# Patient Record
Sex: Female | Born: 1935 | Race: White | Hispanic: No | Smoking: Never smoker
Health system: Southern US, Community
[De-identification: ages and names within clinical notes are randomized; demographics above are authoritative.]

## PROBLEM LIST (undated history)

## (undated) DIAGNOSIS — M1990 Unspecified osteoarthritis, unspecified site: Secondary | ICD-10-CM

## (undated) DIAGNOSIS — I480 Paroxysmal atrial fibrillation: Secondary | ICD-10-CM

## (undated) DIAGNOSIS — Z860100 Personal history of colon polyps, unspecified: Secondary | ICD-10-CM

## (undated) DIAGNOSIS — I4891 Unspecified atrial fibrillation: Secondary | ICD-10-CM

## (undated) DIAGNOSIS — K635 Polyp of colon: Secondary | ICD-10-CM

## (undated) DIAGNOSIS — I495 Sick sinus syndrome: Secondary | ICD-10-CM

## (undated) DIAGNOSIS — K5909 Other constipation: Secondary | ICD-10-CM

## (undated) DIAGNOSIS — R0600 Dyspnea, unspecified: Secondary | ICD-10-CM

## (undated) DIAGNOSIS — Z950 Presence of cardiac pacemaker: Secondary | ICD-10-CM

## (undated) DIAGNOSIS — R5383 Other fatigue: Secondary | ICD-10-CM

## (undated) DIAGNOSIS — I10 Essential (primary) hypertension: Secondary | ICD-10-CM

## (undated) DIAGNOSIS — R1310 Dysphagia, unspecified: Secondary | ICD-10-CM

## (undated) DIAGNOSIS — E785 Hyperlipidemia, unspecified: Secondary | ICD-10-CM

## (undated) DIAGNOSIS — F5104 Psychophysiologic insomnia: Secondary | ICD-10-CM

## (undated) DIAGNOSIS — I341 Nonrheumatic mitral (valve) prolapse: Secondary | ICD-10-CM

## (undated) DIAGNOSIS — K219 Gastro-esophageal reflux disease without esophagitis: Secondary | ICD-10-CM

## (undated) DIAGNOSIS — F419 Anxiety disorder, unspecified: Secondary | ICD-10-CM

## (undated) HISTORY — DX: Other fatigue: R53.83

## (undated) HISTORY — DX: Hyperlipidemia, unspecified: E78.5

## (undated) HISTORY — DX: Sick sinus syndrome: I49.5

## (undated) HISTORY — DX: Other constipation: K59.09

## (undated) HISTORY — DX: Dyspnea, unspecified: R06.00

## (undated) HISTORY — DX: Polyp of colon: K63.5

## (undated) HISTORY — DX: Unspecified osteoarthritis, unspecified site: M19.90

## (undated) HISTORY — PX: CATARACT EXTRACTION: SUR2

## (undated) HISTORY — DX: Psychophysiologic insomnia: F51.04

## (undated) HISTORY — DX: Personal history of colon polyps, unspecified: Z86.0100

## (undated) HISTORY — DX: Presence of cardiac pacemaker: Z95.0

## (undated) HISTORY — DX: Essential (primary) hypertension: I10

## (undated) HISTORY — PX: ENDOMETRIAL BIOPSY: SHX622

## (undated) HISTORY — DX: Dysphagia, unspecified: R13.10

## (undated) HISTORY — DX: Unspecified atrial fibrillation: I48.91

## (undated) HISTORY — DX: Nonrheumatic mitral (valve) prolapse: I34.1

## (undated) HISTORY — DX: Personal history of colonic polyps: Z86.010

## (undated) HISTORY — DX: Anxiety disorder, unspecified: F41.9

## (undated) HISTORY — DX: Paroxysmal atrial fibrillation: I48.0

## (undated) HISTORY — DX: Gastro-esophageal reflux disease without esophagitis: K21.9

---

## 1968-12-21 HISTORY — PX: CYSTECTOMY: SUR359

## 1998-08-19 ENCOUNTER — Ambulatory Visit (HOSPITAL_COMMUNITY): Admission: RE | Admit: 1998-08-19 | Discharge: 1998-08-19 | Payer: Self-pay | Admitting: Gastroenterology

## 1999-03-19 ENCOUNTER — Emergency Department (HOSPITAL_COMMUNITY): Admission: EM | Admit: 1999-03-19 | Discharge: 1999-03-19 | Payer: Self-pay | Admitting: Emergency Medicine

## 2000-06-01 ENCOUNTER — Encounter: Admission: RE | Admit: 2000-06-01 | Discharge: 2000-06-01 | Payer: Self-pay | Admitting: *Deleted

## 2000-06-01 ENCOUNTER — Encounter: Payer: Self-pay | Admitting: *Deleted

## 2001-12-26 ENCOUNTER — Ambulatory Visit (HOSPITAL_COMMUNITY): Admission: RE | Admit: 2001-12-26 | Discharge: 2001-12-26 | Payer: Self-pay | Admitting: Gastroenterology

## 2003-01-01 ENCOUNTER — Other Ambulatory Visit: Admission: RE | Admit: 2003-01-01 | Discharge: 2003-01-01 | Payer: Self-pay | Admitting: Obstetrics and Gynecology

## 2004-03-13 ENCOUNTER — Encounter: Admission: RE | Admit: 2004-03-13 | Discharge: 2004-03-13 | Payer: Self-pay | Admitting: Internal Medicine

## 2004-12-29 ENCOUNTER — Encounter: Admission: RE | Admit: 2004-12-29 | Discharge: 2004-12-29 | Payer: Self-pay | Admitting: Internal Medicine

## 2005-02-16 ENCOUNTER — Ambulatory Visit (HOSPITAL_COMMUNITY): Admission: RE | Admit: 2005-02-16 | Discharge: 2005-02-16 | Payer: Self-pay | Admitting: Gastroenterology

## 2006-04-07 ENCOUNTER — Other Ambulatory Visit: Admission: RE | Admit: 2006-04-07 | Discharge: 2006-04-07 | Payer: Self-pay | Admitting: Obstetrics and Gynecology

## 2006-09-16 ENCOUNTER — Observation Stay (HOSPITAL_COMMUNITY): Admission: EM | Admit: 2006-09-16 | Discharge: 2006-09-17 | Payer: Self-pay | Admitting: Emergency Medicine

## 2006-09-17 ENCOUNTER — Encounter (INDEPENDENT_AMBULATORY_CARE_PROVIDER_SITE_OTHER): Payer: Self-pay | Admitting: Cardiology

## 2008-04-03 ENCOUNTER — Other Ambulatory Visit: Admission: RE | Admit: 2008-04-03 | Discharge: 2008-04-03 | Payer: Self-pay | Admitting: Obstetrics and Gynecology

## 2010-06-03 LAB — HM PAP SMEAR: HM Pap smear: NORMAL

## 2010-12-04 ENCOUNTER — Encounter: Payer: Self-pay | Admitting: Internal Medicine

## 2010-12-04 ENCOUNTER — Encounter
Admission: RE | Admit: 2010-12-04 | Discharge: 2010-12-04 | Payer: Self-pay | Source: Home / Self Care | Attending: Cardiology | Admitting: Cardiology

## 2010-12-07 ENCOUNTER — Encounter: Payer: Self-pay | Admitting: Internal Medicine

## 2010-12-23 ENCOUNTER — Encounter: Payer: Self-pay | Admitting: Internal Medicine

## 2010-12-31 ENCOUNTER — Encounter: Payer: Self-pay | Admitting: Internal Medicine

## 2011-01-02 ENCOUNTER — Encounter: Payer: Self-pay | Admitting: Internal Medicine

## 2011-01-05 ENCOUNTER — Ambulatory Visit
Admission: RE | Admit: 2011-01-05 | Discharge: 2011-01-05 | Payer: Self-pay | Source: Home / Self Care | Attending: Internal Medicine | Admitting: Internal Medicine

## 2011-01-05 ENCOUNTER — Encounter: Payer: Self-pay | Admitting: Internal Medicine

## 2011-01-05 ENCOUNTER — Other Ambulatory Visit: Payer: Self-pay | Admitting: Internal Medicine

## 2011-01-05 DIAGNOSIS — E785 Hyperlipidemia, unspecified: Secondary | ICD-10-CM | POA: Insufficient documentation

## 2011-01-05 DIAGNOSIS — I4891 Unspecified atrial fibrillation: Secondary | ICD-10-CM | POA: Insufficient documentation

## 2011-01-05 DIAGNOSIS — I059 Rheumatic mitral valve disease, unspecified: Secondary | ICD-10-CM | POA: Insufficient documentation

## 2011-01-05 DIAGNOSIS — I495 Sick sinus syndrome: Secondary | ICD-10-CM | POA: Insufficient documentation

## 2011-01-06 LAB — PROTIME-INR
INR: 1 ratio (ref 0.8–1.0)
Prothrombin Time: 11.5 s (ref 10.2–12.4)

## 2011-01-06 LAB — BRAIN NATRIURETIC PEPTIDE: Pro B Natriuretic peptide (BNP): 22.5 pg/mL (ref 0.0–100.0)

## 2011-01-06 LAB — BASIC METABOLIC PANEL
BUN: 20 mg/dL (ref 6–23)
CO2: 25 mEq/L (ref 19–32)
Calcium: 9.7 mg/dL (ref 8.4–10.5)
Chloride: 105 mEq/L (ref 96–112)
Creatinine, Ser: 0.6 mg/dL (ref 0.4–1.2)
GFR: 102.35 mL/min (ref >60.00)
Glucose, Bld: 73 mg/dL (ref 70–99)
Potassium: 4.3 mEq/L (ref 3.5–5.1)
Sodium: 139 mEq/L (ref 135–145)

## 2011-01-06 LAB — APTT: aPTT: 31.1 s — ABNORMAL HIGH (ref 21.7–28.8)

## 2011-01-09 ENCOUNTER — Inpatient Hospital Stay (HOSPITAL_COMMUNITY)
Admission: RE | Admit: 2011-01-09 | Discharge: 2011-01-10 | Payer: Self-pay | Source: Home / Self Care | Attending: Internal Medicine | Admitting: Internal Medicine

## 2011-01-09 DIAGNOSIS — I495 Sick sinus syndrome: Secondary | ICD-10-CM

## 2011-01-09 HISTORY — PX: PACEMAKER INSERTION: SHX728

## 2011-01-09 HISTORY — DX: Sick sinus syndrome: I49.5

## 2011-01-15 NOTE — Discharge Summary (Addendum)
Jamie Morales, Jamie Morales NO.:  [id number]  MEDICAL RECORD NO.:  [id number]          PATIENT TYPE:  INP  LOCATION:  3736                         FACILITY:  [facility]  PHYSICIAN:  Bevelyn Buckles. Bensimhon, MDDATE OF BIRTH:  04/01/1936  DATE OF ADMISSION:  01/09/2011 DATE OF DISCHARGE:  01/10/2011                              DISCHARGE SUMMARY   PRIMARY CARDIOLOGIST:  Georga Hacking, MD  PRIMARY ELECTROPHYSIOLOGIST:  Hillis Range, MD  PRIMARY CARE PHYSICIAN:  Barry Dienes. Eloise Harman, MD  PROCEDURES PERFORMED DURING HOSPITALIZATION:  Implantation of Medtronic Adapta L dual-chamber pacemaker in the setting of symptomatic bradycardia with tachycardia-bradycardia syndrome.  FINAL DISCHARGE DIAGNOSES: 1. 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 2. History of paroxysmal atrial fibrillation. 3. Gastroesophageal reflux disease. 4. Hyperlipidemia. 5. Mitral valve prolapse. 6. Degenerative joint disease.  HOSPITAL COURSE:  Jamie Morales is a 75 year old female with a history as stated above who presented for EP consultation with Dr. Johney Frame on January 05, 2011.  The patient had been placed on flecainide as a "pill in pocket" approach with daily aspirin.  The patient had required use of flecainide 1-2 times a month due to symptoms of atrial fibrillation with episodes lasting several hours.  The patient was seen by Dr. Donnie Aho and a monitor was placed and she was found to have documented sinus node dysfunction with pauses up to 4.3 seconds during wakeful and sleeping hours.  She also had some observed atrial fibrillation with heart rates in the 160s-170s.  It was found that she was not able to take any rate- control medication secondary to bradycardia.  As a result of this, Dr. Johney Frame felt that the patient would be a candidate for pacemaker in the setting of sick sinus syndrome and tachybrady syndrome.  He spoke with her thoroughly concerning the operation and  need for pacemaker and she agreed to proceed.  The patient was admitted and pacemaker was implanted on January 09, 2011.  This is a Medtronic Adapta L model ADDRL1, serial [id number] per Dr. Hillis Range.  The patient tolerated the procedure well without evidence of bleeding, hematoma, or signs of infection.  The patient was not placed on Coumadin as her CHADs score was found to be 0 in the setting of atrial fibrillation which was paroxysmal.  The pacemaker was re-interrogated postprocedure and it was found to be functioning appropriately.  The patient was seen and examined by Dr. Arvilla Meres on day of discharge and found to be stable.  She will follow up with Dr. Donnie Aho and Dr. Johney Frame for continued management.  Most recent labs dated January 05, 2011, reveals a PTT of 31.1, PT 11.5 with an INR of 1.0.  BNP 22.5.  Sodium 139, potassium 4.3, chloride 105, CO2 of 25, glucose 73, BUN 20, and creatinine 0.6.  Chest x-ray dated January 10, 2011, revealing new dual lead transvenous pacemaker at appropriate position without evidence of pneumothorax, stable cardiomegaly, no active lung disease.  VITAL SIGNS ON DISCHARGE:  Blood pressure 131/79, pulse 61, respirations 18, and the patient weighed 79.7 kg.  DISCHARGE MEDICATIONS: 1. Artificial tears 1 drop to both eyes every day  p.r.n. 2. Aspirin 325 mg daily. 3. Calcium carbonate over the counter by mouth daily. 4. Fish oil triple OTC by mouth daily. 5. Flecainide 100 mg 1 tablet by mouth daily p.r.n. for rapid heart     rate. 6. Beta-carotene caps with minerals 1 tablet by mouth daily. 7. Vitamin D3, 800 units by mouth daily.  ALLERGIES: 1. CODEINE. 2. ALENDRONATE. 3. VYTORIN. 4. BENZONATATE.  FOLLOWUP PLANS AND APPOINTMENTS: 1. The patient is scheduled to follow up with Dr. Johney Frame on January 26, 2011. 2. The patient will follow up with her primary cardiologist, Dr.     Donnie Aho for continued cardiac management. 3. She  will follow with Dr. Eloise Harman, primary care physician for     continued medical management. 4. The patient has been given post pacemaker instructions with     particular emphasis on the pacemaker site for evidence of bleeding,     hematoma, or signs of infection.  Time spent with the patient to include physician time 40 minutes.     Bettey Mare. Lyman Bishop, NP   ______________________________ Bevelyn Buckles. Bensimhon, MD    KML/MEDQ  D:  01/10/2011  T:  01/11/2011  Job:  [id number]  cc:   Georga Hacking, M.D. Hillis Range, MD Barry Dienes Eloise Harman, M.D.  Electronically Signed by Joni Reining NP on 01/12/2011 08:39:16 AM Electronically Signed by Arvilla Meres MD on 01/15/2011 04:03:55 PM

## 2011-01-21 ENCOUNTER — Encounter: Payer: Self-pay | Admitting: Internal Medicine

## 2011-01-22 NOTE — Letter (Signed)
Summary: Implantable Device Instructions  Architectural technologist, Main Office  [address]   [place], [place] [address]   Phone: [phone]  Fax: [phone]      Implantable Device Instructions  You are scheduled for:  _____ Permanent Transvenous Pacemaker   on 01/09/11  with Dr. Johney Frame.  1.  Please arrive at the Short Stay Center at [facility] at 10:30am on the day of your procedure.  2.  Do not eat or drink after midnight the night before your procedure.  3.  Complete lab work on 01/01/11.    You do not have to be fasting.  4.  Plan for an overnight stay.  Bring your insurance cards and a list of your medications.  5.  Wash your chest and neck with antibacterial soap (any brand) the evening before and the morning of your procedure.  Rinse well.  6.  Education material received:     Pacemaker _____            *If you have ANY questions after you get home, please call the office [phone]. Anselm Pancoast  *Every attempt is made to prevent procedures from being rescheduled.  Due to the nauture of Electrophysiology, rescheduling can happen.  The physician is always aware and directs the staff when this occurs.

## 2011-01-22 NOTE — Assessment & Plan Note (Signed)
Summary: ep eval appt at 1:45-mb   Visit Type:  Initial Consult Referring Provider:  Dr Donnie Aho   History of Present Illness: Jamie Morales is a pleasant 37 female with h/o paroxysmal atrial fibrillation  and tachycardia/ bradycardia syndrome who presents today for EP consultation. She reports initially being diagnosed with atrial fibrillation 2007 after presenting with symptoms of palpitations.  She has been using flecainide as a "pill in pocket" approach and daily ASA since that time.  She has required flecainide 1-2 times per month due to symptoms of afib.  She feels that episodes typically last several hours.  She reports feeling washed out after an episode of afib. Most recently, she was evaluated by Dr Donnie Aho and had an event monitor placed.  This documented sinus node dysfunction with pauses up to 4.3 seconds during both wakeful and sleeping hours.  She was also observed to have afib with heart rates 160s-170s.  Rate control could not be initiated due to bradycardia.  No reversible causes of bradycardia are found.  She reports dizziness but denies presyncope or syncope.  She is referred for further EP consultation.  Current Medications (verified): 1)  Fish Oil   Oil (Fish Oil) .... Daily 2)  Calcium Carbonate   Powd (Calcium Carbonate) .... Daily 3)  Aspirin Ec 325 Mg Tbec (Aspirin) .... Take One Tablet By Mouth Daily 4)  Vitamin D 400 Unit Caps (Cholecalciferol) .... Daily 5)  Flecainide Acetate 100 Mg Tabs (Flecainide Acetate) .... As Needed 6)  Icaps Areds Formula  Tabs (Multiple Vitamins-Minerals) .... Daily  Allergies (verified): 1)  ! Codeine  Past History:  Past Medical History: Paroxysmal atrial fibrillation GERD HYPERLIPIDEMIA (ICD-272.4) MITRAL VALVE PROLAPSE (ICD-424.0) DJD  denies h/o rheumatic fever     Past Surgical History: benign breast cyst removed 1970  Family History: Reviewed history from 01/05/2011 and no changes required.  Father died at age 12 of  coronary artery disease.  Mother  died at age 37 of coronary artery disease.  A brother who is currently in  his 25s is status post a stroke at age 74.  Her sister in her 66s has  diabetes mellitus type 2, and a sister again in her 30s has had coronary  artery bypass graft surgery.  There is no family history of colon cancer or  breast cancer.  Social History:  She has been a widow since 42.   She lives in [place] alone. She works as a  Interior and spatial designer, and has one daughter who was with her at the time of evaluation.  She has no history of tobacco or alcohol abuse.  Review of Systems       All systems are reviewed and negative except as listed in the HPI.   Vital Signs:  Patient profile:   75 year old female Height:      60 inches Weight:      170 pounds BMI:     33.32 Pulse rate:   59 / minute BP sitting:   138 / 78  (left arm)  Vitals Entered By: Laurance Flatten CMA (January 05, 2011 2:05 PM)  Physical Exam  General:  overweight Head:  normocephalic and atraumatic Eyes:  PERRLA/EOM intact; conjunctiva and lids normal. Mouth:  Teeth, gums and palate normal. Oral mucosa normal. Neck:  supple Lungs:  Clear bilaterally to auscultation and percussion. Heart:  Non-displaced PMI, chest non-tender; regular rate and rhythm, S1, S2 without murmurs, rubs or gallops. Carotid upstroke normal, no bruit. Normal abdominal aortic size,  no bruits. Femorals normal pulses, no bruits. Pedals normal pulses. No edema, no varicosities. Abdomen:  Bowel sounds positive; abdomen soft and non-tender without masses, organomegaly, or hernias noted. No hepatosplenomegaly. Msk:  Back normal, normal gait. Muscle strength and tone normal. Extremities:  No clubbing or cyanosis. Neurologic:  Alert and oriented x 3. Skin:  Intact without lesions or rashes. Psych:  Normal affect.   Echocardiogram  Procedure date:  09/17/2006  Findings:       SUMMARY   -  Overall left ventricular systolic function was normal.  Left         ventricular ejection fraction was estimated to be 65 %. There         were no left ventricular regional wall motion abnormalities.   -  No echocardiographic evidence for mitral valve prolapse. There         was mild mitral valvular regurgitation.   -  Estimated peak pulmonary artery systolic pressure 22 mmHg.    - LEFT ATRIUM                                   NORMAL   LAD                             29    mm      19-13mm   LAD index (A-P)                 1.7   cm/m^2  <2.2 cm/m^2 --------------------------------------------------------------   Prepared and Electronically Authenticated by   Viann Fish M.D.     EKG  Procedure date:  01/05/2011  Findings:      sinus bradycardia 59 bpm PR 210, QRS 78, Qtc 443, otherwise normal ekg  Impression & Recommendations:  Problem # 1:  SICK SINUS SYNDROME (ICD-427.81) The patient presents for EP consultation regarding SSS/ tachy brady syndrome. I have personally reviewed her recent event monitor which reveals afib with RVR (heart rates upo to 160s/170s) as well as sinus bradycardia with multiple pauses of 4 seconds.  She is presently on no AV nodal blocking agents but will likely require these agents for rate control of her afib.  I would therefore recommend that we proceed with PPM. Risks, benefits, alternatives to pacemaker  implantation were discussed in detail with the patient today.  She understands that the risks include but are not limited to bleeding, infection, pneumothorax, perforation, tamponade, vascular damage, renal failure, MI, stroke, death, and lead dislodgement.  She accepts these risks and wishes to proceed.  We will therefore plan PPM at the next available time. I have advised that she not drive in the interim.  Problem # 2:  ATRIAL FIBRILLATION (ICD-427.31) Her afib appears to be mostly controlled with a "pill in pocket" approach using flecainide. She does have episodes of RVR.  We will therefore plan  initiated of a rate controlling medicine (likely cardizem) once her pacemaker has been placed.  Her CHADS2 score is 0.  She is appropriately anticoagulated with Aspirin.  Other Orders: TLB-BMP (Basic Metabolic Panel-BMET) (80048-METABOL) TLB-BNP (B-Natriuretic Peptide) (83880-BNPR) TLB-PT (Protime) (85610-PTP) TLB-PTT (85730-PTTL)  Patient Instructions: 1)  Your physician has recommended that you have a pacemaker inserted.  A pacemaker is a small device that is placed under the skin of your chest or abdomen to help control abnormal heart rhythms. This device uses electrical  pulses to prompt the heart to beat at a normal rate. Pacemakers are used to treat heart rhythms that are too slow. Wires (leads) are attached to the pacemaker that goes into the chambers of your heart. This is done in the hospital and usually requires an overnight stay. Please see the instruction sheet given to you today for more information.

## 2011-01-26 ENCOUNTER — Encounter: Payer: Self-pay | Admitting: Internal Medicine

## 2011-01-26 ENCOUNTER — Encounter (INDEPENDENT_AMBULATORY_CARE_PROVIDER_SITE_OTHER): Payer: MEDICARE

## 2011-01-26 DIAGNOSIS — I495 Sick sinus syndrome: Secondary | ICD-10-CM

## 2011-01-28 NOTE — Letter (Signed)
Summary: [facility]  [facility]   Imported By: Marylou Mccoy 01/21/2011 11:23:03  _____________________________________________________________________  External Attachment:    Type:   Image     Comment:   External Document

## 2011-01-28 NOTE — Letter (Signed)
Summary: [facility]  [facility]   Imported By: Marylou Mccoy 01/15/2011 12:38:39  _____________________________________________________________________  External Attachment:    Type:   Image     Comment:   External Document

## 2011-01-28 NOTE — Procedures (Signed)
Summary: Event  Event   Imported By: Marylou Mccoy 01/15/2011 12:35:41  _____________________________________________________________________  External Attachment:    Type:   Image     Comment:   External Document

## 2011-01-28 NOTE — Letter (Signed)
Summary: [facility] - Cardiac Report  [facility] - Cardiac Report   Imported By: Marylou Mccoy 01/21/2011 11:23:46  _____________________________________________________________________  External Attachment:    Type:   Image     Comment:   External Document

## 2011-01-28 NOTE — Letter (Signed)
Summary: [facility] - Cardiac Report  [facility] - Cardiac Report   Imported By: Marylou Mccoy 01/21/2011 11:21:05  _____________________________________________________________________  External Attachment:    Type:   Image     Comment:   External Document

## 2011-01-28 NOTE — Miscellaneous (Signed)
Summary: Device preload  Clinical Lists Changes  Observations: Added new observation of PPM INDICATN: Sick sinus syndrome (01/21/2011 13:31) Added new observation of MAGNET RTE: BOL 85 ERI 65 (01/21/2011 13:31) Added new observation of PPMLEADSTAT2: active (01/21/2011 13:31) Added new observation of PPMLEADSER2: [id number] (01/21/2011 13:31) Added new observation of PPMLEADMOD2: 4092  (01/21/2011 13:31) Added new observation of PPMLEADLOC2: RV  (01/21/2011 13:31) Added new observation of PPMLEADSTAT1: active  (01/21/2011 13:31) Added new observation of PPMLEADSER1: [id number]  (01/21/2011 13:31) Added new observation of PPMLEADMOD1: 5076  (01/21/2011 13:31) Added new observation of PPMLEADLOC1: RA  (01/21/2011 13:31) Added new observation of PPM IMP MD: Hillis Range, MD  (01/21/2011 13:31) Added new observation of PPMLEADDOI2: 01/09/2011  (01/21/2011 13:31) Added new observation of PPMLEADDOI1: 01/09/2011  (01/21/2011 13:31) Added new observation of PPM DOI: 01/09/2011  (01/21/2011 13:31) Added new observation of PPM SERL#: [id number]  (01/21/2011 13:31) Added new observation of PPM MODL#: ADDRL1  (01/21/2011 69:62) Added new observation of PACEMAKERMFG: Medtronic  (01/21/2011 13:31) Added new observation of PPM REFER MD: Link Snuffer  (01/21/2011 13:31) Added new observation of PACEMAKER MD: Hillis Range, MD  (01/21/2011 13:31)      PPM Specifications Following MD:  Hillis Range, MD     Referring MD:  Link Snuffer PPM Vendor:  Medtronic     PPM Model Number:  ADDRL1     PPM Serial Number:  [id number] PPM DOI:  01/09/2011     PPM Implanting MD:  Hillis Range, MD  Lead 1    Location: RA     DOI: 01/09/2011     Model #: [id number]     Serial #: [id number]     Status: active Lead 2    Location: RV     DOI: 01/09/2011     Model #: [id number]     Serial #: [id number]     Status: active  Magnet Response Rate:  BOL 85 ERI 65  Indications:  Sick sinus syndrome

## 2011-02-04 NOTE — Op Note (Signed)
Jamie, Morales NO.:  [id number]  MEDICAL RECORD NO.:  [id number]          PATIENT TYPE:  INP  LOCATION:  3736                         FACILITY:  [facility]  PHYSICIAN:  Hillis Range, MD       DATE OF BIRTH:  1936-02-06  DATE OF PROCEDURE:  01/09/2011 DATE OF DISCHARGE:                              OPERATIVE REPORT   PREPROCEDURE DIAGNOSES: 1. Sick sinus syndrome. 2. Tachycardia-bradycardia syndrome. 3. Paroxysmal atrial fibrillation.  POSTPROCEDURE DIAGNOSES: 1. Sick sinus syndrome. 2. Tachycardia-bradycardia syndrome. 3. Paroxysmal atrial fibrillation.  PROCEDURES: 1. Left upper extremity venography. 2. Pacemaker implantation.  INTRODUCTION:  Jamie Morales is a pleasant 75 year old female with a history of paroxysmal atrial fibrillation with tachycardia-bradycardia syndrome who now presents for pacemaker implantation.  She has had intermittent episodes of atrial fibrillation for several years, for which she has taken flecainide on an as-needed basis.  She reports occasional episodes of fatigue and dizziness.  She recently was evaluated by Dr. Deborah Chalk and had an event monitor placed.  This documented sinus node dysfunction with pauses up to 4.3 seconds during both wakeful and sleeping hours. She was also observed to have atrial fibrillation with heart rates in 160s-170s.  Rate control could not be initiated due to bradycardia.  She now presents for pacemaker implantation.  DESCRIPTION OF PROCEDURE:  Informed written consent was obtained and the patient was brought to the electrophysiology lab in the fasting state. She was adequately sedated with intravenous Versed and fentanyl as outlined in the nursing report.  The patient's left chest was prepped and draped in the usual sterile fashion by the EP lab staff.  The skin overlying the left deltopectoral region was infiltrated with lidocaine for local analgesia.  A 3-cm incision was made over the  left deltopectoral region.  A left subcutaneous pacemaker pocket was fashioned using a combination of sharp and blunt dissection. Electrocautery was required to assure hemostasis.  The left axillary vein could not be easily cannulated.  A venogram of the left upper extremity was therefore performed, which revealed a moderate-sized left axillary vein which actually appeared to be spasmed as it joined the left subclavian vein.  A moderate-sized left cephalic vein was also observed.  A cutdown was therefore performed and the left cephalic vein was cannulated with direct visualization.  Through the left cephalic vein, a Medtronic model [id number] (serial number [id number]) right atrial lead and a Medtronic model [id number] (serial number [id number]) right ventricular lead were advanced with fluoroscopic visualization into the right atrial appendage and right ventricular apex positions respectively.  Initial atrial lead P-waves measured 2.1 mV with impedance of 706 ohms and a threshold of 0.8 volts at 0.5 milliseconds. The right ventricular lead R-waves measured 11.5 mV with an impedance of 771 ohms and a threshold of 0.4 volts at 0.5 milliseconds.  There is a prominent shelf along the junction of the superior vena cava in the right atrium. The leads continued to one to sit within the shelf.  I therefore left adequate lead slack.  Both leads were secured to the pectoralis fascia using #2 silk suture over the suture sleeves.  The leads were then connected to a Medtronic Adapta L model ADDRL1 (serial number [id number]) pacemaker.  The pocket was irrigated with copious gentamicin solution.  The pacemaker was then placed into the pocket. The pocket was then closed in 2 layers with 2.0 Vicryl suture for the subcutaneous and subcuticular layers.  Steri-Strips and a sterile dressing were then applied.  There were no early apparent complications.  CONCLUSIONS: 1. Successful implantation of a Medtronic  Adapta L dual-chamber     pacemaker for symptomatic bradycardia with tachycardia-bradycardia     syndrome. 2. No early apparent complications.     Hillis Range, MD     JA/MEDQ  D:  01/09/2011  T:  01/10/2011  Job:  [id number]  cc:   Georga Hacking, M.D.  Electronically Signed by Hillis Range MD on 02/04/2011 10:20:43 PM

## 2011-02-05 NOTE — Procedures (Signed)
Summary: Event  Event   Imported By: Marylou Mccoy 01/28/2011 15:07:43  _____________________________________________________________________  External Attachment:    Type:   Image     Comment:   External Document

## 2011-02-11 NOTE — Cardiovascular Report (Signed)
Summary: Office Visit   Office Visit   Imported By: Roderic Ovens 02/03/2011 14:20:56  _____________________________________________________________________  External Attachment:    Type:   Image     Comment:   External Document

## 2011-02-11 NOTE — Consult Note (Signed)
Summary: Darden Palmer MD: Consultation Report  Darden Palmer MD: Consultation Report   Imported By: Earl Many 02/06/2011 14:36:23  _____________________________________________________________________  External Attachment:    Type:   Image     Comment:   External Document

## 2011-02-11 NOTE — Procedures (Signed)
Summary: [facility]/Per Dr. Beryle Flock   Current Medications (verified): 1)  Fish Oil   Oil (Fish Oil) .... Daily 2)  Calcium Carbonate   Powd (Calcium Carbonate) .... Daily 3)  Aspirin Ec 325 Mg Tbec (Aspirin) .... Take One Tablet By Mouth Daily 4)  Flecainide Acetate 100 Mg Tabs (Flecainide Acetate) .... As Needed 5)  Icaps Areds Formula  Tabs (Multiple Vitamins-Minerals) .... Daily 6)  Vitamin D-3 800 Unit Caps (Cholecalciferol) .... One By Mouth Daily  Allergies (verified): 1)  ! Codeine   PPM Specifications Following MD:  Hillis Range, MD     Referring MD:  Link Snuffer PPM Vendor:  Medtronic     PPM Model Number:  ADDRL1     PPM Serial Number:  [id number] PPM DOI:  01/09/2011     PPM Implanting MD:  Hillis Range, MD  Lead 1    Location: RA     DOI: 01/09/2011     Model #: 8657     Serial #: [id number]     Status: active Lead 2    Location: RV     DOI: 01/09/2011     Model #: 4132     Serial #: [id number]     Status: active  Magnet Response Rate:  BOL 85 ERI 65  Indications:  Sick sinus syndrome   PPM Follow Up Battery Voltage:  2.79 V     Battery Est. Longevity:  11.5 yrs       PPM Device Measurements Atrium  Amplitude: 5.60 mV, Impedance: 468 ohms, Threshold: 0.50 V at 0.40 msec Right Ventricle  Amplitude: 22.40 mV, Impedance: 1011 ohms, Threshold: 0.50 V at 0.40 msec  Episodes MS Episodes:  4     Percent Mode Switch:  2.9%     Ventricular High Rate:  0     Atrial Pacing:  58.9%     Ventricular Pacing:  0.2%  Parameters Mode:  MVP     Lower Rate Limit:  60     Upper Rate Limit:  130 Paced AV Delay:  180     Sensed AV Delay:  150 Next Cardiology Appt Due:  04/10/2011 Tech Comments:  WOUND CHECK---STERI STRIPS REMOVED.  NO REDNESS OR SWELLING AT SITE.  NORMAL DEVICE FUNCTION.  NO CHANGES MADE. ROV IN 3 MTHS W/JA. Vella Kohler  January 26, 2011 5:02 PM

## 2011-04-17 ENCOUNTER — Encounter: Payer: Self-pay | Admitting: Internal Medicine

## 2011-04-20 ENCOUNTER — Ambulatory Visit (INDEPENDENT_AMBULATORY_CARE_PROVIDER_SITE_OTHER): Payer: MEDICARE | Admitting: Internal Medicine

## 2011-04-20 ENCOUNTER — Encounter: Payer: Self-pay | Admitting: Internal Medicine

## 2011-04-20 DIAGNOSIS — I4891 Unspecified atrial fibrillation: Secondary | ICD-10-CM

## 2011-04-20 DIAGNOSIS — I495 Sick sinus syndrome: Secondary | ICD-10-CM

## 2011-04-20 NOTE — Assessment & Plan Note (Signed)
Normal pacemaker function See Pace Art report No changes today She will follow with Dr Donnie Aho for longterm pacemaker care.

## 2011-04-20 NOTE — Assessment & Plan Note (Signed)
Stable with occasional afib She does still have RVR during afib and may ultimately require more aggressive rate control.  She will continue ASA at this time for stroke prevention.  Her CHADSVasc score is 2.  She may therefore ultimately benefit from coumadin or a novel anticoagulant if her afib burden increases. I will defer this to Dr Donnie Aho.

## 2011-04-20 NOTE — Patient Instructions (Signed)
Your physician recommends that you schedule a follow-up appointment with Dr Johney Frame as needed

## 2011-04-20 NOTE — Progress Notes (Signed)
The patient presents today for routine electrophysiology followup.  Since last being seen in our clinic, the patient reports doing very well.  She finds that her L chest is occasionally sore, most noticably after working all day as a Producer, television/film/video.  She continues to have occasional episodes of afib with tachy palpitations if she "pushes herself too hard".   Today, she denies symptoms of chest pain, shortness of breath, orthopnea, PND, lower extremity edema, dizziness, presyncope, syncope, or neurologic sequela.  The patient feels that she is tolerating medications without difficulties and is otherwise without complaint today.   Past Medical History  Diagnosis Date  . Paroxysmal atrial fibrillation   . GERD (gastroesophageal reflux disease)   . Hyperlipidemia   . Mitral valve prolapse   . DJD (degenerative joint disease)   . Tachycardia-bradycardia syndrome 01/09/11    s/p PPM by Fawn Kirk (MDT)   Past Surgical History  Procedure Date  . Cystectomy 1970    benign breast cyst removed  . Pacemaker insertion 01/09/11    by Fawn Kirk for tachycardia/ bradycardia syndrome    Current Outpatient Prescriptions  Medication Sig Dispense Refill  . aspirin EC 325 MG EC tablet Take 325 mg by mouth daily.        . Calcium Carbonate POWD 1 Units by Does not apply route daily.        . Fish Oil OIL by Does not apply route daily.        . flecainide (TAMBOCOR) 100 MG tablet Take 100 mg by mouth as needed.        . Multiple Vitamins-Minerals (ICAPS) TABS Take by mouth daily.        . Cholecalciferol (VITAMIN D3 PO) Take 800 capsules by mouth daily.          Allergies  Allergen Reactions  . Codeine     History   Social History  . Marital Status: Widowed    Spouse Name: N/A    Number of Children: 1  . Years of Education: N/A   Occupational History  . hairdresser    Social History Main Topics  . Smoking status: Never Smoker   . Smokeless tobacco: Not on file  . Alcohol Use: No  . Drug Use: Not on file  .  Sexually Active: Not on file   Other Topics Concern  . Not on file   Social History Narrative  . No narrative on file    Family History  Problem Relation Age of Onset  . Coronary artery disease Father   . Coronary artery disease Mother   . Stroke Brother 82  . Diabetes Sister   . Coronary artery disease Sister     CABG   Physical Exam: Filed Vitals:   04/20/11 1142  BP: 140/82  Pulse: 76  Height: 5\' 3"  (1.6 m)  Weight: 175 lb (79.379 kg)    GEN- The patient is well appearing, alert and oriented x 3 today.   Head- normocephalic, atraumatic Eyes-  Sclera clear, conjunctiva pink Ears- hearing intact Oropharynx- clear Neck- supple, no JVP Lymph- no cervical lymphadenopathy Lungs- Clear to ausculation bilaterally, normal work of breathing Chest- pacemaker pocket is well healed Heart- Regular rate and rhythm, no murmurs, rubs or gallops, PMI not laterally displaced GI- soft, NT, ND, + BS Extremities- no clubbing, cyanosis, or edema MS- no significant deformity or atrophy Skin- no rash or lesion Psych- euthymic mood, full affect Neuro- strength and sensation are intact  Pacemaker interrogation- reviewed in detail today,  See PACEART report  Assessment and Plan:

## 2011-05-08 NOTE — Op Note (Signed)
Jamie Morales, Jamie Morales                ACCOUNT NO.:  [id number]   MEDICAL RECORD NO.:  [id number]          PATIENT TYPE:  AMB   LOCATION:  ENDO                         FACILITY:  [facility]   PHYSICIAN:  James L. Malon Kindle., M.D.DATE OF BIRTH:  14-Oct-1936   DATE OF PROCEDURE:  02/16/2005  DATE OF DISCHARGE:                                 OPERATIVE REPORT   PROCEDURE:  Esophagogastroduodenoscopy.   MEDICATIONS:  Fentanyl 50 mcg, Versed 5 mg IV. The patient received Unasyn  at her request due to history of mitral valve prolapse 1.5 g.   INDICATIONS FOR PROCEDURE:  Heme positive stool along with a previous  history of colon polyps.   DESCRIPTION OF PROCEDURE:  The procedure had been explained to the patient  and consent obtained.  In the left lateral decubitus position, the Olympus  scope was inserted and advanced. The stomach was entered, pylorus identified  and passed.  The duodenum including the bulb and second portion were seen  well and was unremarkable.  The scope was withdrawn back into the stomach  and the pyloric channel. The duodenal bulb were all normal. The antrum and  body of the stomach were normal with no ulceration or inflammation. The  fundus and cardia were seen well on the retroflexed view and were normal.  There was a small hiatal hernia and the distal and proximal esophagus  endoscopically normal. The scope was withdrawn.  The patient tolerated the  procedure well.   ASSESSMENT:  Heme positive stool with negative endoscopy, 792.1.   PLAN:  Will proceed with colonoscopy at this time.      JLE/MEDQ  D:  02/16/2005  T:  02/16/2005  Job:  [id number]   cc:   Barry Dienes. Eloise Harman, M.D.  [address]  [place]  [place] [address]  Fax: [phone]

## 2011-05-08 NOTE — Op Note (Signed)
[facility]  Patient:    Jamie Morales, Jamie Morales Visit Number: [id number] MRN: [id number]          Service Type: END Location: ENDO Attending Physician:  Orland Mustard Dictated by:   Llana Aliment. Randa Evens, M.D. Proc. Date: 12/26/01 Admit Date:  12/26/2001   CC:         Barry Dienes. Eloise Harman, M.D.   Operative Report  PROCEDURE:  Colonoscopy.  MEDICATIONS:  Fentanyl 75 mcg, Versed 6 mg IV.  SCOPE:  Olympus Pediatric video colonoscope.  INDICATION:  Previous history of adenomatous polyps. This is done as his three-year followup.  DESCRIPTION OF PROCEDURE:  The procedure had been explained to the patient and consent obtained. The patient was placed in the left lateral decubitus position. The Olympus Pediatric video colonoscope was inserted and advanced under direct visualization. The prep was excellent. Using abdominal pressure, we were able to reach the cecum. The ileocecal valve was seen. The scope was withdrawn and the cecum, ascending colon, hepatic flexure, transverse colon, splenic flexure, descending and sigmoid colon were seen well upon removal. There were no polyps seen; no significant diverticular disease. The scope was withdrawn. The patient tolerated the procedure well.  ASSESSMENT:  No evidence of any further adenomatous colon polyps in this woman who has had previous polyps.  PLAN:  Will recommend yearly Hemoccults and follow up with her family physician and a screening colonoscopy in five years. Dictated by:   Llana Aliment. Randa Evens, M.D. Attending Physician:  Orland Mustard DD:  12/26/01 TD:  12/26/01 Job: [id number]

## 2011-05-08 NOTE — Discharge Summary (Signed)
NAMEJOLEY, UTECHT NO.:  [id number]   MEDICAL RECORD NO.:  [id number]          PATIENT TYPE:  INP   LOCATION:  3740                         FACILITY:  [facility]   PHYSICIAN:  Barry Dienes. Eloise Harman, M.D.DATE OF BIRTH:  1936-10-04   DATE OF ADMISSION:  09/16/2006  DATE OF DISCHARGE:  09/17/2006                                 DISCHARGE SUMMARY   HISTORY OF PRESENT ILLNESS:  The patient is a 75 year old white female with  a history of hyperlipidemia and family history of premature coronary artery  disease who had a 01/2006 adenosine Cardiolite test suggesting anterior  ischemia, but declined further workup at that time.  She complained of 1-2  days of marked fatigue with palpitations, orthopnea, paroxysmal nocturnal  dyspnea, and epigastric discomfort.  Her symptoms were somewhat intermittent  and last experienced the night prior to admission.  She was seen in the  office and at that time was somewhat anxious but had no symptoms of chest  discomfort or shortness of breath.   PAST MEDICAL HISTORY:  Mitral valve prolapse, gastroesophageal reflux  disease with hiatal hernia, hyperlipidemia for which she declined Statin  treatment.   MEDICATIONS PRIOR TO ADMISSION:  1. Aspirin 81 mg daily.  2. Os-Cal D 500 twice daily.  3. Prilosec 20 mg daily p.r.n.  4. Valium 5 mg daily p.r.n. anxiety or insomnia.  5. Ostial BiFlex p.r.n.   INITIAL PHYSICAL EXAM:  VITAL SIGNS:  Blood pressure 132/104, pulse 112,  respirations 20, temperature 98, pulse oxygen saturation 97% on room air.  GENERAL:  She is a mildly overweight white female who is in no apparent  distress while sitting upright.  NECK:  Was without jugular venous distension or carotid bruit.  CHEST:  Clear to auscultation.  HEART:  Had a regular rate and rhythm without significant murmur or gallop.  ABDOMEN: Benign.  EXTREMITIES:  Without cyanosis, clubbing, or edema.   LABORATORY STUDIES:  On 12/28/05 fasting lipids  include cholesterol 261,  triglycerides 257, HDL 43, LDL 167.   On 02/17/06 adenosine Cardiolite test showed the following:  1. Left ventricular ejection fraction 84% with hyperdynamic wall motion      and normal thickening.  2. Large reversible anterior wall defect consistent with ischemia.   Current EKG shows the following:  1. Atrial fibrillation with a ventricular rate ranging from 70-95.  2. Nonspecific ST-segment and T-wave abnormalities.   HOSPITAL COURSE:  The patient was admitted to a medical bed with telemetry.  She was started on IV heparin and Toprol XL 25 mg twice daily.  She was seen  by cardiologist, Dr. Viann Fish who did cardiac catheterization on  09/17/06.  The studies showed the following:  1. Left ventricular ejection fraction 70%  2. Left main coronary artery normal.  3. Left anterior descending artery with mild scattered luminal      irregularities and no significant stenosis.  4. Circumflex artery had a large marginal branch and continuation branch      that was free of disease.  5. Right coronary artery had mild proximal luminal irregularity.  6.  No significant obstructive stenosis.   The patient spontaneously converted to a sinus rhythm while in the hospital.   PROCEDURES:  Cardiac catheterization.   COMPLICATIONS:  None.   CONDITION ON DISCHARGE:  She felt fine.  Her right groin was without a  hematoma.  She was without shortness of breath or chest pain.   DISCHARGE DIAGNOSES:  1. Chest pain, noncardiac.  2. Gastroesophageal reflux disease.  3. Hypertension, essential, controlled.  4. Hyperlipidemia.  5. Paroxysmal atrial fibrillation.  6. Exogenous obesity.  7. Mitral valve prolapse.   DISCHARGE MEDICATIONS:  1. Aspirin 81 mg daily.  2. Toprol XL 50 mg daily.  3. Omega 3 fatty acid 1 gram daily.   DISPOSITION AND FOLLOW-UP:  She should be seen in follow-up by Dr. Eloise Harman  at [facility] in approximately 1 month  following discharge.  She should also be seen by Dr. Ree Kida at his office approximately 1  week following discharge.  She was given the appropriate telephone numbers  to schedule these appointments.           ______________________________  Barry Dienes. Eloise Harman, M.D.     DGP/MEDQ  D:  10/14/2006  T:  10/15/2006  Job:  [id number]

## 2011-05-08 NOTE — Op Note (Signed)
Jamie Morales, Jamie Morales                ACCOUNT NO.:  [id number]   MEDICAL RECORD NO.:  [id number]          PATIENT TYPE:  AMB   LOCATION:  ENDO                         FACILITY:  [facility]   PHYSICIAN:  James L. Malon Kindle., M.D.DATE OF BIRTH:  16-Apr-1936   DATE OF PROCEDURE:  02/16/2005  DATE OF DISCHARGE:                                 OPERATIVE REPORT   PROCEDURE:  Colonoscopy.   MEDICATIONS:  The patient received Unasyn 1.5 g IV preop, fentanyl 75 mcg  and Versed 8 mg for both procedure.   SCOPE:  Olympus pediatric adjustable colonoscope.   INDICATIONS FOR PROCEDURE:  Heme positive stool with a previous history of  adenomatous colon polyps.   DESCRIPTION OF PROCEDURE:  The procedure had been explained to the patient  and consent obtained. Following the endoscopy, the patient was turned  around, the pediatric adjustable colonoscope was inserted and advanced.  The  prep was excellent. We were able to advance easily to the cecum. The  ileocecal valve and appendiceal orifice were seen. The scope was withdrawn  and the cecum, ascending colon, transverse colon, splenic flexure,  descending and sigmoid colon were seen well. No polyps were seen throughout.  The patient had no diverticulosis. The rectum was free of polyps. There were  some small internal hemorrhoids seen in the rectum upon removal. The scope  was withdrawn.  The patient tolerated the procedure well. There were no  immediate problems.   ASSESSMENT:  1.  Heme positive stools, 792.1, with negative colonoscopy.  2.  Personal history of colon polyps and negative colonoscopy, V12.72.  3.  Internal hemorrhoids, 455.0.   PLAN:  Will recommend yearly Hemoccults, repeat colonoscopy in five years.  Will see back in the office in three months to recheck her stools.      JLE/MEDQ  D:  02/16/2005  T:  02/16/2005  Job:  [id number]   cc:   Barry Dienes. Eloise Harman, M.D.  [address]  [place]  [place] [address]  Fax: [phone]

## 2011-05-08 NOTE — H&P (Signed)
NAMEVALJEAN, RUPPEL NO.:  [id number]   MEDICAL RECORD NO.:  [id number]          PATIENT TYPE:  INP   LOCATION:  3740                         FACILITY:  [facility]   PHYSICIAN:  Barry Dienes. Eloise Harman, M.D.DATE OF BIRTH:  Nov 14, 1936   DATE OF ADMISSION:  09/16/2006  DATE OF DISCHARGE:                                HISTORY & PHYSICAL   CHIEF COMPLAINT:  Difficulty breathing.   HISTORY OF PRESENT ILLNESS:  The patient is a 75 year old white female with  a history of hyperlipidemia and a family history of premature coronary  artery disease, and a February 2007 adenosine Cardiolite test suggesting  anterior ischemia, who complains of 1 to 2 days of marked fatigue with  palpitations, orthopnea, paroxysmal nocturnal dyspnea, and epigastric  discomfort.  Her symptoms were somewhat intermittent and last experienced  last night when trying to sleep.  At the time of my office evaluation, she  felt fine other than been being somewhat anxious about the possible etiology  of her symptoms.   PAST MEDICAL HISTORY:  Mitral valve prolapse, gastroesophageal reflux  disease as well hiatal hernia, hyperlipidemia for which she declined Statin  treatment.   FAMILY HISTORY:  Has a family history of diabetes mellitus, type 2.   MEDICATIONS:  Prior to admission:  1. Aspirin 81 mg daily.  2. Os-Cal D 500 twice daily.  3. Prilosec 20 mg daily p.r.n.  4. Valium 5 mg daily p.r.n. anxiety or insomnia.  5. __________  3 tablets twice daily.  6. Osteo BiFlex p.r.n.   MEDICATION INTOLERANCES:  CODEINE, FOSAMAX, AND VYTORIN HAS BEEN ASSOCIATED  WITH NAUSEA, TESSALON HAS BEEN ASSOCIATED WITH HEADACHES.   SOCIAL HISTORY:  She has been a widow since 63.  She works as a  Interior and spatial designer, and has one daughter who was with her at the time of  evaluation.  She has no history of tobacco or alcohol abuse.   FAMILY HISTORY:  Father died at age 43 of coronary artery disease.  Mother  died at age 79 of  coronary artery disease.  A brother who is currently in  his 66s is status post a stroke at age 88.  Her sister in her 21s has  diabetes mellitus type 2, and a sister again in her 82s has had coronary  artery bypass graft surgery.  There is no family history of colon cancer or  breast cancer.   REVIEW OF SYSTEMS:  Notable for recent fatigue, dry cough, mild dyspnea on  exertion, intermittent mild epigastric pain.  She denies recent fever,  chills, substernal chest pain, constipation, or depression.   INITIAL PHYSICAL EXAM:  VITAL SIGNS:  Blood pressure 132/104, pulse 112,  respirations 20, temp 98, pulse oxygen saturation 97% on room air.  GENERAL:  She is a mildly overweight white female who is in no apparent  distress while sitting upright.  NECK:  Was without jugular venous distension or carotid bruit.  CHEST:  Clear to auscultation.  HEART:  Had a irregular rate, irregular rhythm without murmur or gallop.  ABDOMEN:  Had normal bowel sounds and no hepatosplenomegaly  or tenderness.  EXTREMITIES:  Without cyanosis, clubbing, or edema.   LABORATORY STUDIES:  December 28, 2005, fasting lipids include a total  cholesterol 261, triglycerides 257, HDL 43, LDL 167.  February 17, 2006,  adenosine Cardiolite test showed the following:  1. Left ventricular ejection fraction 84% with hyperdynamic wall motion      and normal thickening.  2. Large reversible anterior wall defect consistent with ischemia.   Current EKG shows the following:  1. Atrial fibrillation with a ventricular rate ranging from 70 to 95.  2. Nonspecific ST-segment abnormalities.   CURRENT LABS:  Include white blood cell count 6.2, hemoglobin 13, hematocrit  37, platelets 258, serum sodium 140, potassium 4.4, chloride 108, carbon  dioxide 26, BUN 11, creatinine 0.8, glucose 99, troponin-I 0.02.   IMPRESSION AND PLAN:  1. Dyspnea:  Likely secondary to diastolic dysfunction in the face of      probable underlying coronary  artery disease.  2. I plan to request a cardiology evaluation in the a.m.  For now, we will      continue her aspirin treatment and add IV heparin and Toprol XL 25 mg      twice daily.  __________  will be added to her regimen to help decrease      her anxiety symptoms and secondarily possible angina.  3. Hyperlipidemia:  Moderately severe.  We will again try to convince her      of the benefits of a Statin treatment.  She is likely be more receptive      to a trial of medications such as Zocor.  Significant coronary artery      disease is verified on her hospital evaluation.           ______________________________  Barry Dienes Eloise Harman, M.D.     DGP/MEDQ  D:  09/17/2006  T:  09/18/2006  Job:  [id number]   cc:   Georga Hacking, M.D.

## 2011-05-08 NOTE — Cardiovascular Report (Signed)
Jamie Morales, Jamie Morales NO.:  [id number]   MEDICAL RECORD NO.:  [id number]          PATIENT TYPE:  INP   LOCATION:  3740                         FACILITY:  [facility]   PHYSICIAN:  Georga Hacking, M.D.DATE OF BIRTH:  08-30-1936   DATE OF PROCEDURE:  DATE OF DISCHARGE:                              CARDIAC CATHETERIZATION   PROCEDURE:  Cardiac catheterization.   HISTORY:  A 75 year old female who presented to the hospital with shortness  of breath, vague chest tightness, nausea and atrial fibrillation that  reverted to sinus rhythm.  She has a previous Cardiolite stress test that  showed a previous area of anterior ischemia versus breast attenuation.  She  is brought to the catheterization laboratory for further evaluation.   COMMENTS ABOUT THE PROCEDURE:  The patient tolerated this procedure well  without complications.  The right femoral artery was entered using a single  anterior needle wall stick.  She tolerated this procedure well without  complications.  Good hemostasis, peripheral pulses noted into the procedure.   HEMODYNAMIC DATA:  Aorta post contrast 117/74, LV postcontrast 117/310.   ANGIOGRAPHIC DATA:  Left ventriculogram:  Performed in the 30 degrees RAO  projection.  The aortic valve is normal.  The mitral valve was normal.  The  left ventricle appears normal in size, wall motion is normal.  Estimated  ejection fraction is 70%.  Coronary arteries rise and distribute normally.  There is no significant coronary calcification present.  The left main  coronary artery is normal.  Left anterior descending contained mild  scattered irregularities proximally.  There was no significant coronary  artery disease identified in the LAD except for mild proximal irregularity.  Circumflex coronary artery has a large marginal branch, continuation branch  and also is free of disease.  The right coronary artery initially had  catheter dampened with engagement.  Sublingual  nitroglycerin was given and  angiograms did not reveal any significant stenosis in the right coronary  artery.  There is mild irregularity proximally.   IMPRESSION:  1. Minimal irregularity in LAD and proximal right coronary artery without      significant obstructive stenosis.  2. Normal LV function.   RECOMMENDATIONS:  The patient likely has atrial fibrillation as a cause for  her symptoms.  An echo will be reviewed.  TSH will be reviewed.  Treatment  of atrial fibrillation.      Georga Hacking, M.D.  Electronically Signed    WST/MEDQ  D:  09/17/2006  T:  09/19/2006  Job:  [id number]   cc:   Brunilda Payor

## 2011-05-08 NOTE — Consult Note (Signed)
Jamie Morales, Jamie Morales NO.:  [id number]   MEDICAL RECORD NO.:  [id number]          PATIENT TYPE:  INP   LOCATION:  3740                         FACILITY:  [facility]   PHYSICIAN:  Georga Hacking, M.D.DATE OF BIRTH:  January 11, 1936   DATE OF CONSULTATION:  09/17/2006  DATE OF DISCHARGE:                                   CONSULTATION   This 75 year old female is seen at the request of Dr. Eloise Harman for  evaluation of mitral valve prolapse, atrial fibrillation, fatigue, and  nausea.  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  It would be relieved if she would sit or rest.  She has had dyspnea on exertion but does not have reproduction of the mid-  back pain with exertion.  She was evaluated in January of this year and had  a Cardiolite stress test that showed possible anterior ischemia, consistent  with a large anterior wall defect.  At that time, she refused  catheterization, and her symptoms had appeared to be somewhat improved.  She  has had a small chamber ventricle and did not have pressure or tightness in  her chest suggestive of angina.  After review of the Cardiolite, she did  refuse further testing at that time.  She has had some dyspnea and recently  had a 3-day history of significant dyspnea with nausea and feeling very  fatigued.  Her daughter noted that, when her sister went over to see her,  she was very red-faced and had not been sleeping well.  When seen in Dr.  Silvano Rusk office yesterday, she was noted to be in atrial fibrillation and  was admitted to the hospital and started on heparin and begun on beta  blockers.  She converted back to sinus rhythm last evening.  Her cardiac  enzymes have been negative, and she denies any chest pressure or tightness.  She has not had PND or orthopnea and does not have syncope.  I was asked to  assess  her this morning.   PAST MEDICAL HISTORY:  Remarkable for a history of hyperlipidemia, reflux  esophagitis and history of lumbar disk disease.  She carries a diagnosis of  mitral valve prolapse previously from another physician.  She has had a  history of breast lumpectomy.   ALLERGIES:  CODEINE CAUSES NAUSEA.  FOSAMAX AND VYTORIN CAUSE NAUSEA.   CURRENT MEDICATIONS:  Aspirin and fish oil.   SOCIAL HISTORY:  She is widowed and has one daughter and lives alone.  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  She has never smoked  and drinks alcohol socially.   FAMILY HISTORY:  Father dies of coronary disease at age 75.  Mother died at  80 of coronary disease.  A brother has had a previous stroke.  A sister is  diabetic, and another sister is alive and well with a history of bypass  grafting.  She has been obese and has gained  50 pounds since her husband's  death.  She has a history of malaise and fatigue.  She has early macular  degeneration noted.  She has no difficulty with hearing, epistaxis,  hoarseness or difficulty speaking.  She has a history of hiatal hernia and a  history of colon polyps which are adenomatous and has had colonoscopy  previously.  She has had some stress incontinence and urinary frequency.  She has a history of both lumbar and cervical disk disease.  She does not  sleep well.  She denies history of recurrent headache, stroke or TIA.   EXAMINATION:  GENERAL:  She is a pleasant elderly woman who mildly anxious.  VITAL SIGNS:  Her blood pressure is currently 130/70.  She is currently in  sinus rhythm.  Pulse is currently 60.  SKIN:  Warm and dry without obvious lesions.  ENT:  Tinnie Gens, CNS clear.  Fundi not examined.  Pharynx negative.  NECK:  Supple without masses, JVD, thyromegaly or bruits.  LUNGS:  Clear to auscultation and percussion.  CARDIAC:  Normal S1 and S2, no S3, S4 or murmur.  ABDOMEN:  Soft and nontender.  No mass,  hepatosplenomegaly or aneurysm.  EXTREMITIES:  Femoral and distal pulses are 2+.  NEUROLOGIC:  Normal.   EKG shows a slightly prolonged QT interval but is otherwise unremarkable.   LABORATORY DATA:  Normal with negative cardiac enzymes.  TSH is pending.   IMPRESSION:  1. Symptoms of nausea, dyspnea that may be due to atrial fibrillation.  2. Atrial fibrillation which is resolved.  3. History of an abnormal stress Cardiolite test.  4. Obesity with previous weight gain.  5. History of lumbar and cervical disk disease.   RECOMMENDATIONS:  At this point, she had atrial fibrillation that may  account for her symptoms but could have some ischemia.  I think she should  have a cardiac catheterization and an echocardiogram.  I recommended cardiac  cath to her, and she was agreeable and willing to proceed.  We will plan to  do this and discuss the possibility of angioplasty or stenting,, if a  culprit lesion is noted.  She will need to be on at least aspirin, because  of her atrial fibrillation.  She did not have other risk-free embolization  at this time.  She has a history of hyperlipidemia and may warrant treatment  for this also.  Thank you for asking me to see her with you.      Georga Hacking, M.D.  Electronically Signed     WST/MEDQ  D:  09/17/2006  T:  09/17/2006  Job:  [id number]   cc:   Barry Dienes. Eloise Harman, M.D.

## 2011-08-10 LAB — HM DEXA SCAN

## 2011-08-10 LAB — HM MAMMOGRAPHY: HM Mammogram: NORMAL

## 2011-11-10 DIAGNOSIS — H35369 Drusen (degenerative) of macula, unspecified eye: Secondary | ICD-10-CM | POA: Insufficient documentation

## 2011-12-22 HISTORY — PX: BLEPHAROPLASTY: SUR158

## 2012-02-23 DIAGNOSIS — Z961 Presence of intraocular lens: Secondary | ICD-10-CM | POA: Insufficient documentation

## 2012-02-23 DIAGNOSIS — H0230 Blepharochalasis unspecified eye, unspecified eyelid: Secondary | ICD-10-CM | POA: Insufficient documentation

## 2012-03-25 ENCOUNTER — Telehealth: Payer: Self-pay | Admitting: Internal Medicine

## 2012-03-25 NOTE — Telephone Encounter (Signed)
03-25-12 lmm @ 318pm for pt to set up pace ck with allred/mt

## 2012-04-18 ENCOUNTER — Encounter: Payer: Medicare Other | Admitting: Internal Medicine

## 2012-04-19 DIAGNOSIS — Z950 Presence of cardiac pacemaker: Secondary | ICD-10-CM | POA: Insufficient documentation

## 2012-04-19 DIAGNOSIS — R0600 Dyspnea, unspecified: Secondary | ICD-10-CM | POA: Insufficient documentation

## 2012-04-19 DIAGNOSIS — I10 Essential (primary) hypertension: Secondary | ICD-10-CM | POA: Insufficient documentation

## 2012-05-10 DIAGNOSIS — H35369 Drusen (degenerative) of macula, unspecified eye: Secondary | ICD-10-CM | POA: Insufficient documentation

## 2012-07-22 ENCOUNTER — Telehealth: Payer: Self-pay | Admitting: Internal Medicine

## 2012-07-22 NOTE — Telephone Encounter (Signed)
07-22-12 lmm @ 418pm for pt tp rs cxl pacer ck with allred/brooke/mt

## 2012-08-11 ENCOUNTER — Other Ambulatory Visit: Payer: Self-pay | Admitting: Cardiology

## 2013-05-19 ENCOUNTER — Encounter: Payer: Self-pay | Admitting: *Deleted

## 2013-05-23 ENCOUNTER — Encounter: Payer: Self-pay | Admitting: Obstetrics and Gynecology

## 2013-05-23 ENCOUNTER — Ambulatory Visit (INDEPENDENT_AMBULATORY_CARE_PROVIDER_SITE_OTHER): Payer: Medicare Other | Admitting: Obstetrics and Gynecology

## 2013-05-23 VITALS — BP 124/80 | Ht <= 58 in | Wt 175.0 lb

## 2013-05-23 DIAGNOSIS — N318 Other neuromuscular dysfunction of bladder: Secondary | ICD-10-CM

## 2013-05-23 DIAGNOSIS — N3281 Overactive bladder: Secondary | ICD-10-CM

## 2013-05-23 DIAGNOSIS — Z01419 Encounter for gynecological examination (general) (routine) without abnormal findings: Secondary | ICD-10-CM

## 2013-05-23 NOTE — Addendum Note (Signed)
Addended by: Alison Murray on: 05/23/2013 03:43 PM   Modules accepted: Level of Service

## 2013-05-23 NOTE — Patient Instructions (Signed)

## 2013-05-23 NOTE — Progress Notes (Signed)
77 y.o.   Widowed    Caucasian   female   G2P1   here for annual exam.  Over the last 6 months has gained weight and has since noticed more urinary leakage with nl activities like lifting or bending.  She is quite distressed over it.  Wears a pad every day and thinks other can smell urine on her.  No nocturia.    No LMP recorded.          Sexually active: no  The current method of family planning is none.    Exercising: just started [employer] about a month ago.   Last mammogram: Ausgust 27, 2013   Last pap smear:06/03/2010 History of abnormal pap: no Smoking:no Alcohol: no Last colonoscopy: about 3 years ago, 3 precancerous polyps, repeat in 5 years.  Dr. Randa Evens.  Last Bone Density: June 2010   Last tetanus shot: 2002 Last cholesterol check: Dr Jarold Motto checks it yearly; is always a little high  Hgb:                Urine:   Family History  Problem Relation Age of Onset  . Coronary artery disease Father   . Coronary artery disease Mother   . Stroke Brother 62  . Diabetes Sister   . Coronary artery disease Sister     CABG    Patient Active Problem List   Diagnosis Date Noted  . HYPERLIPIDEMIA 01/05/2011  . MITRAL VALVE PROLAPSE 01/05/2011  . ATRIAL FIBRILLATION 01/05/2011  . SICK SINUS SYNDROME 01/05/2011    Past Medical History  Diagnosis Date  . Paroxysmal atrial fibrillation   . GERD (gastroesophageal reflux disease)   . Hyperlipidemia   . Mitral valve prolapse   . DJD (degenerative joint disease)   . 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 Hypertension   . History of colon polyps   . Chronic constipation     Past Surgical History  Procedure Laterality Date  . Cystectomy  1970    benign breast cyst removed  . Pacemaker insertion  01/09/11    by Fawn Kirk for tachycardia/ bradycardia syndrome  . Cataract extraction      Allergies: Codeine  Current Outpatient Prescriptions  Medication Sig Dispense Refill  . aspirin EC 325 MG EC  tablet Take 325 mg by mouth daily.        . Calcium Carbonate POWD 1 Units by Does not apply route daily.        . Cholecalciferol (VITAMIN D3 PO) Take 800 capsules by mouth daily.        . Fish Oil OIL by Does not apply route daily.        . flecainide (TAMBOCOR) 100 MG tablet Take 100 mg by mouth as needed.        . Multiple Vitamins-Minerals (ICAPS) TABS Take by mouth daily.         No current facility-administered medications for this visit.    ROS: Pertinent items are noted in HPI.  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  One child, widowed.  Wears hearing aids but only to watch TV.     Exam:    BP 124/80  Ht 4' 9.5" (1.461 m)  Wt 175 lb (79.379 kg)  BMI 37.19 kg/m2  Ht stable and wt down 2 pounds from 2 years ago (her last visit here)   Wt Readings from Last 3 Encounters:  05/23/13 175 lb (79.379  kg)  04/20/11 175 lb (79.379 kg)  01/05/11 170 lb (77.111 kg)     Ht Readings from Last 3 Encounters:  05/23/13 4' 9.5" (1.461 m)  04/20/11 5\' 3"  (1.6 m)  01/05/11 5' (1.524 m)    General appearance: alert, cooperative and appears stated age Head: Normocephalic, without obvious abnormality, atraumatic Neck: no adenopathy, supple, symmetrical, trachea midline and thyroid not enlarged, symmetric, no tenderness/mass/nodules Lungs: clear to auscultation bilaterally Breasts: Inspection negative, No nipple retraction or dimpling, No nipple discharge or bleeding, No axillary or supraclavicular adenopathy, Normal to palpation without dominant masses Heart: regular rate and rhythm Abdomen: soft, non-tender; bowel sounds normal; no masses,  no organomegaly, obese Extremities: extremities normal, atraumatic, no cyanosis or edema Skin: Skin color, texture, turgor normal. No rashes or lesions Lymph nodes: Cervical, supraclavicular, and axillary nodes normal. No abnormal inguinal nodes palpated Neurologic: Grossly normal   Pelvic: External genitalia:  no lesions               Urethra:  normal appearing urethra with no masses, tenderness or lesions              Bartholins and Skenes: normal                 Vagina: normal appearing vagina with normal color and discharge, no lesions, atrophic              Cervix: normal appearance              Pap taken: no        Bimanual Exam:  Uterus:  uterus is normal size, shape, consistency and nontender                                      Adnexa: normal adnexa in size, nontender and no masses                                      Rectovaginal: Confirms                                      Anus:  normal sphincter tone, no lesions  A: normal menopausal exam, no HRT     Overactive bladder, worsening over the last 6 months, distressing to pt     Chronic constipation     Pacemaker in place     P: mammogram counseled on breast self exam, mammography screening, adequate intake of calcium and vitamin D, diet and exercise return annually or prn One month of Toviaz samples 4 mg given with instructions.  Pt will try them and see if she gets results and will call for Rx if she desires. Warned re: potential for worsening constipation - advised fiber supplement, better eating habits, more water, and more exercise.        An After Visit Summary was printed and given to the patient.

## 2013-06-09 ENCOUNTER — Telehealth: Payer: Self-pay | Admitting: Internal Medicine

## 2013-06-09 ENCOUNTER — Encounter: Payer: Self-pay | Admitting: Internal Medicine

## 2013-06-09 NOTE — Telephone Encounter (Signed)
06-09-13 called pt , just rings busy, sent past due letter/mt

## 2013-07-21 ENCOUNTER — Telehealth: Payer: Self-pay | Admitting: Internal Medicine

## 2013-07-21 ENCOUNTER — Encounter: Payer: Self-pay | Admitting: Internal Medicine

## 2013-07-21 NOTE — Telephone Encounter (Signed)
07-21-13 pt now follows with tilley/mt

## 2013-07-21 NOTE — Telephone Encounter (Signed)
07-21-13 sent past due letter certified/mt

## 2013-07-21 NOTE — Telephone Encounter (Signed)
Pt follows with Dr Donnie Aho

## 2014-01-23 DIAGNOSIS — H353190 Nonexudative age-related macular degeneration, unspecified eye, stage unspecified: Secondary | ICD-10-CM | POA: Insufficient documentation

## 2014-01-23 DIAGNOSIS — H26499 Other secondary cataract, unspecified eye: Secondary | ICD-10-CM | POA: Insufficient documentation

## 2014-05-09 ENCOUNTER — Encounter: Payer: Self-pay | Admitting: Cardiology

## 2014-06-05 ENCOUNTER — Encounter: Payer: Self-pay | Admitting: Gynecology

## 2014-06-05 ENCOUNTER — Ambulatory Visit: Payer: Medicare Other | Admitting: Obstetrics and Gynecology

## 2014-06-05 ENCOUNTER — Ambulatory Visit (INDEPENDENT_AMBULATORY_CARE_PROVIDER_SITE_OTHER): Payer: Medicare HMO | Admitting: Gynecology

## 2014-06-05 VITALS — BP 124/74 | Resp 14 | Ht 58.75 in | Wt 179.0 lb

## 2014-06-05 DIAGNOSIS — N950 Postmenopausal bleeding: Secondary | ICD-10-CM

## 2014-06-05 DIAGNOSIS — Z01419 Encounter for gynecological examination (general) (routine) without abnormal findings: Secondary | ICD-10-CM

## 2014-06-05 LAB — POCT URINALYSIS DIPSTICK
Leukocytes, UA: NEGATIVE
PH UA: 5
Urobilinogen, UA: NEGATIVE

## 2014-06-05 NOTE — Progress Notes (Signed)
78 y.o. Widowed Caucasian female   G2P1 here for annual exam. She does report post-menopasual bleeding, noticed a tinge of blood on pantiliner in front, pt is on xarelto.  Describes as pink, smelled like blood. Noticed twice. Pt also has issue with chronic constipation-no hemorrhoids. No dysuria, no hematuria, no issue with continence. No HRT history.     No LMP recorded.          Sexually active: no  The current method of family planning is post menopausal status.    Exercising: no  The patient does not participate in regular exercise at present. Last pap: 06/03/10 Negative  Abnormal PAP: no Mammogram: 2014 per patient. BSE: yes  Colonoscopy: 2011 DEXA: 3 years ago- Normal  Alcohol:  no Tobacco: no  Labs: Ivery Qualeaniel Patterson, MD  Health Maintenance  Topic Date Due  . Tetanus/tdap  07/29/1957  . Colonoscopy  07/29/1988  . Zostavax  07/29/1998  . Pneumococcal Polysaccharide Vaccine Age 76 And Over  07/30/2003  . Mammogram  08/09/2013  . Influenza Vaccine  07/21/2014    Family History  Problem Relation Age of Onset  . Coronary artery disease Father   . Coronary artery disease Mother   . Stroke Brother [id number]  . Diabetes Sister   . Coronary artery disease Sister     CABG    Patient Active Problem List   Diagnosis Date Noted  . HYPERLIPIDEMIA 01/05/2011  . MITRAL VALVE PROLAPSE 01/05/2011  . ATRIAL FIBRILLATION 01/05/2011  . SICK SINUS SYNDROME 01/05/2011    Past Medical History  Diagnosis Date  . Paroxysmal atrial fibrillation   . GERD (gastroesophageal reflux disease)   . Hyperlipidemia   . Mitral valve prolapse   . DJD (degenerative joint disease)   . 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 Hypertension   . History of colon polyps   . Chronic constipation     Past Surgical History  Procedure Laterality Date  . Cystectomy  1970    benign breast cyst removed  . Pacemaker insertion  01/09/11    by Fawn KirkJA for tachycardia/ bradycardia syndrome   . Cataract extraction      Allergies: Codeine  Current Outpatient Prescriptions  Medication Sig Dispense Refill  . aspirin EC 325 MG EC tablet Take 325 mg by mouth daily.        . Calcium Carbonate POWD 1 Units by Does not apply route daily.        . Cholecalciferol (VITAMIN D3 PO) Take 800 capsules by mouth daily.        . Fish Oil OIL by Does not apply route daily.        . flecainide (TAMBOCOR) 100 MG tablet Take 100 mg by mouth as needed.        . Multiple Vitamins-Minerals (ICAPS) TABS Take by mouth daily.        Carlena Hurl. XARELTO 20 MG TABS tablet       . fluticasone (FLONASE) 50 MCG/ACT nasal spray        No current facility-administered medications for this visit.    ROS: Pertinent items are noted in HPI.  Exam:    BP 124/74  Resp 14  Ht 4' 10.75" (1.492 m)  Wt 179 lb (81.194 kg)  BMI 36.47 kg/m2 Weight change: @WEIGHTCHANGE @ Last 3 height recordings:  Ht Readings from Last 3 Encounters:  06/05/14 4' 10.75" (1.492 m)  05/23/13 4' 9.5" (1.461 m)  04/20/11 5\' 3"  (1.6 m)  General appearance: alert, cooperative and appears stated age Head: Normocephalic, without obvious abnormality, atraumatic Neck: no adenopathy, no carotid bruit, no JVD, supple, symmetrical, trachea midline and thyroid not enlarged, symmetric, no tenderness/mass/nodules Lungs: clear to auscultation bilaterally Breasts: normal appearance, no masses or tenderness Heart: regular rate and rhythm, S1, S2 normal, no murmur, click, rub or gallop Abdomen: soft, non-tender; bowel sounds normal; no masses,  no organomegaly Extremities: extremities normal, atraumatic, no cyanosis or edema Skin: Skin color, texture, turgor normal. No rashes or lesions Lymph nodes: Cervical, supraclavicular, and axillary nodes normal. no inguinal nodes palpated Neurologic: Grossly normal   Pelvic: External genitalia:  no lesions              Urethra: normal appearing urethra with no masses, tenderness or lesions               Bartholins and Skenes: normal                 Vagina: atrophic              Cervix: normal appearance              Pap taken: no        Bimanual Exam:  Uterus:  Small mobile,  Non-tender                                      Adnexa:    no masses                                      Rectovaginal: Confirms                                      Anus:  normal sphincter tone, no lesions  1. Routine gynecological examination counseled on breast self exam, mammography screening, osteoporosis, adequate intake of calcium and vitamin D, diet and exercise return annually or prn  2. Postmenopausal bleeding No evidence on exam for etiology of spotting, stressed the importance of fully evaluating to rule out uterine pathology. Pt did not small amount of blood in urine dip, no UTI sx Questions addressed, pt agrees with plan - POCT Urinalysis Dipstick - Urine Culture - US Transvaginal Non-OB; Future - Urine Microscopic     An After Visit Summary was printed and given to the patient.

## 2014-06-06 LAB — URINALYSIS, MICROSCOPIC ONLY
Bacteria, UA: NONE SEEN
CASTS: NONE SEEN
CRYSTALS: NONE SEEN
SQUAMOUS EPITHELIAL / LPF: NONE SEEN

## 2014-06-07 LAB — URINE CULTURE: Colony Count: 5000

## 2014-06-11 ENCOUNTER — Telehealth: Payer: Self-pay | Admitting: *Deleted

## 2014-06-11 NOTE — Telephone Encounter (Signed)
Patient notified see result note 

## 2014-06-11 NOTE — Telephone Encounter (Signed)
Left Message To Call Back  

## 2014-06-11 NOTE — Telephone Encounter (Signed)
Message copied by Lorraine LaxSHAW, JASMINE J on Mon Jun 11, 2014  9:50 AM ------      Message from: Douglass RiversLATHROP, TRACY      Created: Fri Jun 08, 2014  7:20 AM       Inform no urinary infection, she should keep her PUS ------

## 2014-06-13 ENCOUNTER — Telehealth: Payer: Self-pay | Admitting: Gynecology

## 2014-06-13 NOTE — Telephone Encounter (Signed)
Spoke with patient. Advised that per benefit quote received, she will be responsible for $10 copay when she comes in for PUS. Patient agreeable. Scheduled PUS. Advised patient of 72 hour cancellation policy and $100 cancellation fee. Patient agreeable.

## 2014-06-13 NOTE — Telephone Encounter (Signed)
Left message for patient to call back. Need to go over benefits and schedule PUS °

## 2014-06-19 ENCOUNTER — Ambulatory Visit (INDEPENDENT_AMBULATORY_CARE_PROVIDER_SITE_OTHER): Payer: Medicare HMO | Admitting: Gynecology

## 2014-06-19 ENCOUNTER — Ambulatory Visit (INDEPENDENT_AMBULATORY_CARE_PROVIDER_SITE_OTHER): Payer: Medicare HMO

## 2014-06-19 VITALS — BP 114/60 | Resp 18 | Ht 58.75 in | Wt 178.0 lb

## 2014-06-19 DIAGNOSIS — N950 Postmenopausal bleeding: Secondary | ICD-10-CM

## 2014-06-19 NOTE — Progress Notes (Signed)
      Pt here f/u of PMB.  Pt had noted spotting on the front of her pantiliner.  She had a negative u/a, urine culture and no signs of bleeding in the vagina. PUS shows the uterus to be atrophic 5.3x4x2.7cm EMS 2.3, ovaries atrophic, no free fluid Images reviewed with pt. On further review, pt has noticed this spotting intermittently for some time She is on xarelto by Dr Arlyn Leakilly Discussed concerns re uterine pathology i n light of frequent spotting and recommend EMB, Risks and benefits of emb discussed.  Risks of bleeding, perforation and infection.  Procedure outlined Pt defers today as she had another appointment. Only available on Monday and Tuesday but will be out of town next week Will plan on 7/13 week. Questions addressed 8085m spent counseling, >50% face to face

## 2014-06-27 ENCOUNTER — Telehealth: Payer: Self-pay | Admitting: Gynecology

## 2014-06-27 NOTE — Telephone Encounter (Signed)
Left message for patient to call back. Need to go over benefits and schedule EMB. °

## 2014-06-28 NOTE — Telephone Encounter (Signed)
Patient returning Sabrina's call. °

## 2014-07-02 NOTE — Telephone Encounter (Signed)
Scheduled EMB

## 2014-07-02 NOTE — Telephone Encounter (Signed)
Spoke with patient. Advised that per benefit quote received, she will be responsible for a $40 copay when she comes in for EMB.  Patient had questions regarding a balance on her acct. [facility]ed call to [facility]

## 2014-07-09 ENCOUNTER — Telehealth: Payer: Self-pay | Admitting: Gynecology

## 2014-07-09 NOTE — Telephone Encounter (Signed)
Patient canceled EMB appt for next Tuesday said she will be out of town and will call back to reschedule. Did not want it to get missed or not rescheduled so sending to you.

## 2014-07-10 NOTE — Telephone Encounter (Signed)
Dr. Farrel GobbleLathrop,  Patient states she will call back to r/s appointment. How do you want to proceed?

## 2014-07-13 NOTE — Telephone Encounter (Signed)
pls call pt back and remind her of need to do Bx, if she will not schedule, we will need to send a letter

## 2014-07-17 ENCOUNTER — Ambulatory Visit: Payer: Medicare HMO | Admitting: Gynecology

## 2014-07-27 NOTE — Telephone Encounter (Signed)
Call to patient to advise of Dr Farrel GobbleLathrop recommendation to have endometrial biopsy ASAP. Patient states she is traveling for next 5 days and will call back when she returns. Offered to proceed with scheduling for when she returns. Advised this test to evaluate her PMB and rule out and precancer or cancerous cells and so should be done at her earliest convenience. Patient asking about cost, advised per previous precert, estimate is $40 co-pay. Patient agreeable to schedule for a Tuesday, not this coming week due to travel. Scheduled for 08-07-14 at 1000, instructed to take Motrin 800 mg one hour prior with food.  Per patient request called back to home number and left message with appointment  Info on home VM.  Routing to provider for final review. Patient agreeable to disposition. Will close encounter

## 2014-08-07 ENCOUNTER — Ambulatory Visit (INDEPENDENT_AMBULATORY_CARE_PROVIDER_SITE_OTHER): Payer: Medicare HMO | Admitting: Gynecology

## 2014-08-07 ENCOUNTER — Encounter: Payer: Self-pay | Admitting: Gynecology

## 2014-08-07 VITALS — BP 141/96 | HR 82 | Resp 18 | Ht 58.75 in | Wt 171.0 lb

## 2014-08-07 DIAGNOSIS — N882 Stricture and stenosis of cervix uteri: Secondary | ICD-10-CM

## 2014-08-07 DIAGNOSIS — N950 Postmenopausal bleeding: Secondary | ICD-10-CM

## 2014-08-07 NOTE — Patient Instructions (Signed)
Endometrial Biopsy Post- Procedure Instructions  1. You may take Ibuprofen, Aleve or Tylenol for pain if needed.     Cramping should resolve within 24 hours.  2.  You may have a small amount of spotting. You should wear a mini pad for the next few days.  3. You may have intercourse after 24 hours.  4. You need to call if you have any pelvic pain, fever, heavy bleeding or foul smelling     vaginal discharge.  5. Shower or bathe as normal.  6. We will call you within one week with results or we will discuss the results at your follow-up appointment if needed.  

## 2014-08-07 NOTE — Progress Notes (Signed)
Pt her for EMB due to recurrent pink/red blood noted on panitliner.  Pt is on xarelto since 2/15.  Pt reports no recent bleeding. Pt had PUS  With EMS of 2.3, uteurs atrophic 5.4cm, 2 small fibroids. Questions addressed Risks and benefits rereviewed.   Endometrial Biopsy Procedure Note  Pre-operative Diagnosis: PMB  Post-operative Diagnosis: same  Indications: postmenopausal bleeding  Procedure Details   Urine pregnancy test was not done.  The risks (including infection, bleeding, pain, and uterine perforation) and benefits of the procedure were explained to the patient and Written informed consent was obtained.  The patient was placed in the dorsal lithotomy position.  Bimanual exam showed the uterus to be in the anteroflexed position.  A peterson's speculum inserted in the vagina, and the cervix prepped with povidone iodine. Xylocaine jelly placed anterior lip and endocervix.   Endocervical os, stenotic.   A sharp tenaculum was applied to the anterior lip of the cervix for stabilization.  A sterile os finder was used to dilate the cervix.  A Mylex 3mm curette was set to just before 6mm and used to sample the endometrium.  Sample was sent for pathologic examination. 2 passes, minimal tiusse  Condition: Stable  Complications: None  Plan:  The patient was advised to call for any fever or for prolonged or severe pain or bleeding. She was advised to use tylenol as needed for mild to moderate pain. She was advised to avoid vaginal intercourse for 48 hours or until the bleeding has completely stopped.

## 2014-08-09 LAB — IPS OTHER TISSUE BIOPSY

## 2014-08-10 ENCOUNTER — Telehealth: Payer: Self-pay

## 2014-08-10 NOTE — Telephone Encounter (Signed)
Message copied by Jannet AskewHINES, Namari Breton E on Fri Aug 10, 2014 12:58 PM ------      Message from: Douglass RiversLATHROP, TRACY      Created: Fri Aug 10, 2014  9:20 AM       Inform that the biopsy was benign, stress the importance of following up for any bleeding or spotting that she notices when it happens so we may better assess ------

## 2014-08-10 NOTE — Telephone Encounter (Signed)
Spoke with patient. Advised of results as seen below. Patient agreeable and verbalizes understanding.  Routing to provider for final review. Patient agreeable to disposition. Will close encounter

## 2014-10-22 ENCOUNTER — Encounter: Payer: Self-pay | Admitting: Gynecology

## 2015-06-10 ENCOUNTER — Ambulatory Visit: Payer: Medicare HMO | Admitting: Gynecology

## 2015-06-18 ENCOUNTER — Ambulatory Visit (INDEPENDENT_AMBULATORY_CARE_PROVIDER_SITE_OTHER): Payer: Medicare HMO | Admitting: Neurology

## 2015-06-18 ENCOUNTER — Encounter: Payer: Self-pay | Admitting: Neurology

## 2015-06-18 VITALS — BP 130/78 | HR 66 | Resp 16 | Ht <= 58 in | Wt 176.0 lb

## 2015-06-18 DIAGNOSIS — G2581 Restless legs syndrome: Secondary | ICD-10-CM

## 2015-06-18 DIAGNOSIS — E669 Obesity, unspecified: Secondary | ICD-10-CM | POA: Diagnosis not present

## 2015-06-18 DIAGNOSIS — R0683 Snoring: Secondary | ICD-10-CM | POA: Diagnosis not present

## 2015-06-18 DIAGNOSIS — R351 Nocturia: Secondary | ICD-10-CM | POA: Diagnosis not present

## 2015-06-18 DIAGNOSIS — I480 Paroxysmal atrial fibrillation: Secondary | ICD-10-CM

## 2015-06-18 DIAGNOSIS — G4719 Other hypersomnia: Secondary | ICD-10-CM | POA: Diagnosis not present

## 2015-06-18 DIAGNOSIS — G478 Other sleep disorders: Secondary | ICD-10-CM | POA: Diagnosis not present

## 2015-06-18 NOTE — Progress Notes (Addendum)
Subjective:    Patient ID: Jamie Morales is a 79 y.o. female.  HPI     Huston Foley, MD, PhD [facility] [address], [address] [address] [place], [place] [address]  Dear Dr. Eloise Harman,   I saw your patient, Jamie Morales, upon your kind request in my neurologic clinic today for initial consultation of her sleep disorder, in particular, concern for underlying obstructive sleep apnea. The patient is accompanied today. As you know, [name] is a 79 year old right-handed woman with an underlying medical history of paroxysmal atrial fibrillation, status post pacemaker implantation in 2011 for sick sinus syndrome, anxiety, reflux disease, hyperlipidemia, hypertension, mitral valve prolapse, back pain, neck pain, and obesity, who reports snoring, nonrestorative sleep and excessive daytime somnolence as well as nocturia, usually once per night. She lives alone, and steadily has gained weight. She likes to nap in the afternoons. Her ESS 7/24 and her FSS 29/63. There is no FHx of OSA, but her father died in his sleep at 56. Her mother died at 41 in the hospital around the time of her open heart surgery.  Both sisters have heart conditions, her brother is 68 and had a stroke at age 53.  I reviewed your office note from 05/17/2015, which you kindly included. She has occasional restless leg symptoms but is not sure if she twitches in her sleep. She has no significant morning headaches. She is usually in bed before midnight. She has trouble falling asleep sometimes. Her rise time is usually between 7:30 and 8 AM but she gets out of bed around 8:30. 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 She has some low back pain and joint stiffness at times. She does not drink caffeine at this time because of her heart. She is a nonsmoker and drinks alcohol very rarely maybe once a month or so.   Her Past Medical History Is Significant For: Past Medical History  Diagnosis Date  . Paroxysmal  atrial fibrillation   . GERD (gastroesophageal reflux disease)   . Hyperlipidemia   . Mitral valve prolapse   . DJD (degenerative joint disease)   . 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 Hypertension   . History of colon polyps   . Chronic constipation   . Chronic insomnia   . Fatigue   . Anxiety   . Colon polyps   . Dyspnea   . Dysphagia   . Pacemaker   . A-fib     Her Past Surgical History Is Significant For: Past Surgical History  Procedure Laterality Date  . Cystectomy  1970    benign breast cyst removed  . Pacemaker insertion  01/09/11    by Fawn Kirk for tachycardia/ bradycardia syndrome  . Cataract extraction    . Blepharoplasty  2013  . Endometrial biopsy      Her Family History Is Significant For: Family History  Problem Relation Age of Onset  . Coronary artery disease Father   . Coronary artery disease Mother   . Stroke Brother 74  . Diabetes Sister   . Coronary artery disease Sister     CABG    Her Social History Is Significant For: History   Social History  . Marital Status: Widowed    Spouse Name: N/A  . Number of Children: 1  . Years of Education: HS   Occupational History  . hairdresser    Social History Main Topics  . Smoking status: Never Smoker   .  Smokeless tobacco: Not on file  . Alcohol Use: No  . Drug Use: No  . Sexual Activity: Not on file   Other Topics Concern  . None   Social History Narrative   Denies caffeine use     Her Allergies Are:  Allergies  Allergen Reactions  . Codeine Nausea And Vomiting  . Fosamax [Alendronate Sodium]   . Tessalon [Benzonatate]   . Toprol Xl [Metoprolol Tartrate]   . Vytorin [Ezetimibe-Simvastatin]   :   Her Current Medications Are:  Outpatient Encounter Prescriptions as of 06/18/2015  Medication Sig  . Calcium-Magnesium (CAL-MAG PO) Take by mouth.  . Cholecalciferol (VITAMIN D3 PO) Take 800 capsules by mouth daily.    . Fish Oil OIL by Does not apply route  daily.    . flecainide (TAMBOCOR) 100 MG tablet Take 100 mg by mouth as needed.    Marland Kitchen MAGNESIUM CITRATE PO Take by mouth.  . Multiple Vitamins-Minerals (ICAPS) TABS Take by mouth daily.    . Potassium 99 MG TABS Take by mouth.  . Probiotic Product (PROBIOTIC PO) Take by mouth.  . vitamin B-12 (CYANOCOBALAMIN) 100 MCG tablet Take 100 mcg by mouth daily.  Carlena Hurl 20 MG TABS tablet   . [DISCONTINUED] aspirin EC 325 MG EC tablet Take 325 mg by mouth daily.    . [DISCONTINUED] Calcium Carbonate POWD 1 Units by Does not apply route daily.    . [DISCONTINUED] Coenzyme Q10 (CO Q 10) 10 MG CAPS Take by mouth.  . [DISCONTINUED] cyclobenzaprine (FLEXERIL) 10 MG tablet Take 10 mg by mouth 3 (three) times daily as needed for muscle spasms.  . [DISCONTINUED] fluticasone (FLONASE) 50 MCG/ACT nasal spray   . [DISCONTINUED] omeprazole (PRILOSEC) 20 MG capsule Take 20 mg by mouth daily.  . [DISCONTINUED] promethazine (PHENERGAN) 25 MG tablet Take 25 mg by mouth every 6 (six) hours as needed for nausea or vomiting.   No facility-administered encounter medications on file as of 06/18/2015.  :  Review of Systems:  Out of a complete 14 point review of systems, all are reviewed and negative with the exception of these symptoms as listed below:   Review of Systems  Constitutional: Positive for fatigue.       Weight gain   HENT: Positive for hearing loss.   Eyes:       Blurred vision   Respiratory: Positive for shortness of breath.   Gastrointestinal: Positive for constipation.  Neurological: Positive for weakness.       Snoring, Trouble falling asleep, wakes up about once a night, witnessed apnea, wakes up feeling tired, daytime tiredness, takes naps during day.   Psychiatric/Behavioral:       Decreased energy, change in appetite     Objective:  Neurologic Exam  Physical Exam Physical Examination:   There were no vitals filed for this visit.  General Examination: The patient is a very pleasant 79  y.o. female in no acute distress. She appears well-developed and well-nourished and well groomed.   HEENT: Normocephalic, atraumatic, pupils are equal, round and reactive to light and accommodation. Funduscopic exam is normal with sharp disc margins noted. She is status post bilateral cataract repairs. Extraocular tracking is good without limitation to gaze excursion or nystagmus noted. Normal smooth pursuit is noted. Hearing is impaired. Tympanic membranes are  mildly obscured with cerumen. Face is symmetric with normal facial animation and normal facial sensation. Speech is clear with no dysarthria noted. There is no hypophonia. There is no lip, neck/head, jaw  or voice tremor. Neck is supple with full range of passive and active motion. There are no carotid bruits on auscultation. Oropharynx exam reveals: mild mouth dryness, adequate dental hygiene and mild to moderate airway crowding secondary to narrow airway entry and redundant soft palate. Tonsils are small bilaterally. Mallampati is class III. Tongue protrudes centrally and palate elevates symmetrically. Neck size is 15.5 inches. She has a Mild overbite. Nasal inspection reveals no significant nasal mucosal bogginess or redness and no septal deviation.   Chest: Clear to auscultation without wheezing, rhonchi or crackles noted.  Heart: S1+S2+0, regular and normal without murmurs, rubs or gallops noted.   Abdomen: Soft, non-tender and non-distended with normal bowel sounds appreciated on auscultation.  Extremities: There is no pitting edema in the distal lower extremities bilaterally. Pedal pulses are intact.  Skin: Warm and dry without trophic changes noted. There are no varicose veins.  Musculoskeletal: exam reveals no obvious joint deformities, tenderness or joint swelling or erythema.   Neurologically:  Mental status: The patient is awake, alert and oriented in all 4 spheres. Her immediate and remote memory, attention, language skills and  fund of knowledge are appropriate. There is no evidence of aphasia, agnosia, apraxia or anomia. Speech is clear with normal prosody and enunciation. Thought process is linear. Mood is normal and affect is normal.  Cranial nerves II - XII are as described above under HEENT exam. In addition: shoulder shrug is normal with equal shoulder height noted. Motor exam: Normal bulk, strength and tone is noted. There is no drift, tremor or rebound. Romberg is negative. Reflexes are 2+ throughout. Babinski: Toes are flexor bilaterally. Fine motor skills and coordination: intact with normal finger taps, normal hand movements, normal rapid alternating patting, normal foot taps and normal foot agility.  Cerebellar testing: No dysmetria or intention tremor on finger to nose testing. Heel to shin is unremarkable bilaterally. There is no truncal or gait ataxia.  Sensory exam: intact to light touch, pinprick, vibration, temperature sense in the upper and lower extremities.  Gait, station and balance: She stands easily. No veering to one side is noted. No leaning to one side is noted. Posture is age-appropriate and stance is narrow based. Gait shows normal stride length and normal pace. No problems turning are noted. She turns en bloc. Tandem walk is slightly difficult for her.   Assessment and Plan:  In summary, Foye ClockJanice L Vitale is a very pleasant 79 y.o.-year old female with an underlying medical history of paroxysmal atrial fibrillation, status post pacemaker implantation in 2011 for sick sinus syndrome, anxiety, reflux disease, hyperlipidemia, hypertension, mitral valve prolapse, back pain, neck pain, and obesity, whose history and physical exam are indeed concerning for obstructive sleep apnea (OSA). She also reports mild restless leg symptoms. I had a long chat with the patient about my findings and the diagnosis of OSA, its prognosis and treatment options. We talked about medical treatments, surgical interventions and  non-pharmacological approaches. I explained in particular the risks and ramifications of untreated moderate to severe OSA, especially with respect to developing cardiovascular disease down the Road, including congestive heart failure, difficult to treat hypertension, cardiac arrhythmias, or stroke. Even type 2 diabetes has, in part, been linked to untreated OSA. Symptoms of untreated OSA include daytime sleepiness, memory problems, mood irritability and mood disorder such as depression and anxiety, lack of energy, as well as recurrent headaches, especially morning headaches. We talked about trying to maintain a healthy lifestyle in general, as well as the importance of  weight control. I encouraged the patient to eat healthy, exercise daily and keep well hydrated, to keep a scheduled bedtime and wake time routine, to not skip any meals and eat healthy snacks in between meals. I advised the patient not to drive when feeling sleepy. I recommended the following at this time: sleep study with potential positive airway pressure titration. (We will score hypopneas at 4% and split the sleep study into diagnostic and treatment portion, if the estimated. 2 hour AHI is >15/h).   I explained the sleep test procedure to the patient and also outlined possible surgical and non-surgical treatment options of OSA, including the use of a custom-made dental device (which would require a referral to a specialist dentist or oral surgeon), upper airway surgical options, such as pillar implants, radiofrequency surgery, tongue base surgery, and UPPP (which would involve a referral to an ENT surgeon). Rarely, jaw surgery such as mandibular advancement may be considered.  I also explained the CPAP treatment option to the patient, who indicated that she would be willing to try CPAP if the need arises. I explained the importance of being compliant with PAP treatment, not only for insurance purposes but primarily to improve Her symptoms, and  for the patient's long term health benefit, including to reduce Her cardiovascular risks. I answered all her questions today and the patient was in agreement. I would like to see her back after the sleep study is completed and encouraged her to call with any interim questions, concerns, problems or updates.   Thank you very much for allowing me to participate in the care of this nice patient. If I can be of any further assistance to you please do not hesitate to call me at [phone].  Sincerely,   Huston Foley, MD, PhD

## 2015-06-18 NOTE — Patient Instructions (Signed)

## 2015-07-08 ENCOUNTER — Ambulatory Visit: Payer: Medicare HMO | Admitting: Obstetrics and Gynecology

## 2015-08-05 ENCOUNTER — Ambulatory Visit (INDEPENDENT_AMBULATORY_CARE_PROVIDER_SITE_OTHER): Payer: Medicare HMO | Admitting: Neurology

## 2015-08-05 DIAGNOSIS — G4761 Periodic limb movement disorder: Secondary | ICD-10-CM

## 2015-08-05 DIAGNOSIS — G4733 Obstructive sleep apnea (adult) (pediatric): Secondary | ICD-10-CM

## 2015-08-05 NOTE — Sleep Study (Signed)
Please see the scanned sleep study interpretation located in the procedure tab in the chart view section.  

## 2015-08-08 ENCOUNTER — Telehealth: Payer: Self-pay | Admitting: Neurology

## 2015-08-08 DIAGNOSIS — G4733 Obstructive sleep apnea (adult) (pediatric): Secondary | ICD-10-CM

## 2015-08-08 NOTE — Telephone Encounter (Signed)
Patient seen on 06/18/15, PSG on 08/05/15. Ins: [employer] Medicare (?) Please call and notify the patient that the recent sleep study did confirm the diagnosis of obstructive sleep apnea and that I recommend treatment for this in the form of CPAP. This will require a repeat sleep study for proper titration and mask fitting. Please explain to patient and arrange for a CPAP titration study. I have placed an order in the chart. Thanks, and please route to [facility] for scheduling.   Huston Foley, MD, PhD [facility])

## 2015-08-09 NOTE — Telephone Encounter (Signed)
Faxed to PCP

## 2015-08-09 NOTE — Telephone Encounter (Signed)
Left message on both number to call back for results.

## 2015-08-13 NOTE — Telephone Encounter (Signed)
Left message to call back  

## 2015-08-13 NOTE — Telephone Encounter (Signed)
Patient is returning your call about the results of her CPAP test.  Please call!  Thanks!

## 2015-08-14 ENCOUNTER — Telehealth: Payer: Self-pay | Admitting: Neurology

## 2015-08-14 NOTE — Telephone Encounter (Signed)
I spoke to patient and she is aware of results and recommendation. She states that she has appt with PCP next week and would like to discuss it with him. She will call back after her visit. I have faxed the report to Dr. Eloise Harman.

## 2015-08-14 NOTE — Telephone Encounter (Signed)
Patient returned a call to get sleep test results

## 2015-08-14 NOTE — Telephone Encounter (Signed)
Duplicate message. 

## 2015-09-03 ENCOUNTER — Telehealth: Payer: Self-pay | Admitting: Neurology

## 2015-09-03 NOTE — Telephone Encounter (Signed)
I notice in the notes there is a CPAP to be completed but the order in the system is not for CPAP.  Can I get it changed to CPAP?

## 2015-09-03 NOTE — Telephone Encounter (Signed)
Patient needs titration study, first study is done.

## 2015-09-03 NOTE — Telephone Encounter (Signed)
I spoke to [facility] and she has the order that she needs.

## 2015-09-09 ENCOUNTER — Ambulatory Visit (INDEPENDENT_AMBULATORY_CARE_PROVIDER_SITE_OTHER): Payer: Medicare HMO | Admitting: Neurology

## 2015-09-09 ENCOUNTER — Ambulatory Visit (INDEPENDENT_AMBULATORY_CARE_PROVIDER_SITE_OTHER): Payer: Medicare HMO | Admitting: Obstetrics and Gynecology

## 2015-09-09 ENCOUNTER — Encounter: Payer: Self-pay | Admitting: Obstetrics and Gynecology

## 2015-09-09 VITALS — BP 130/80 | HR 78 | Resp 16 | Ht <= 58 in | Wt 172.0 lb

## 2015-09-09 DIAGNOSIS — G4733 Obstructive sleep apnea (adult) (pediatric): Secondary | ICD-10-CM | POA: Diagnosis not present

## 2015-09-09 DIAGNOSIS — G4761 Periodic limb movement disorder: Secondary | ICD-10-CM

## 2015-09-09 DIAGNOSIS — G479 Sleep disorder, unspecified: Secondary | ICD-10-CM

## 2015-09-09 DIAGNOSIS — Z124 Encounter for screening for malignant neoplasm of cervix: Secondary | ICD-10-CM

## 2015-09-09 DIAGNOSIS — Z01419 Encounter for gynecological examination (general) (routine) without abnormal findings: Secondary | ICD-10-CM

## 2015-09-09 DIAGNOSIS — M8580 Other specified disorders of bone density and structure, unspecified site: Secondary | ICD-10-CM | POA: Diagnosis not present

## 2015-09-09 NOTE — Sleep Study (Signed)
Please see the scanned sleep study interpretation located in the procedure tab in the chart view section.  

## 2015-09-09 NOTE — Progress Notes (Signed)
Patient ID: Jamie Morales, female   DOB: Oct 09, 1936, 79 y.o.   MRN: [id number] 79 y.o. G2P1 WidowedCaucasianF here for annual exam.  The patient was evaluated last year for vaginal bleeding. No further bleeding. She has bad constipation, last started on miralax daily last week. Stools are thiner, coming out fine, one BM a day. She has nocturia x 1, normal amounts. Rare leakage if she lifts she lifts something heavy. Mild and tolerable. Widow x 22 years. Not sexually active.     No LMP recorded. Patient is postmenopausal.          Sexually active: No.  The current method of family planning is post menopausal status.    Exercising: No.  The patient does not participate in regular exercise at present. Smoker:  no  Health Maintenance: Pap: 06-03-10 WNl History of abnormal Pap:  no MMG:  2015 [facility] WNL per patient Colonoscopy:  2011 Polyps repeat in 5 years BMD:  08-10-11  Low Bone Mass TDaP: unsure Gardasil: N/A   reports that she has never smoked. She has never used smokeless tobacco. She reports that she does not drink alcohol or use illicit drugs.She has a daughter, 2 grandsons. One grandson in [place] (27).   Past Medical History  Diagnosis Date  . Paroxysmal atrial fibrillation   . GERD (gastroesophageal reflux disease)   . Hyperlipidemia   . Mitral valve prolapse   . DJD (degenerative joint disease)   . 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 Hypertension   . History of colon polyps   . Chronic constipation   . Chronic insomnia   . Fatigue   . Anxiety   . Colon polyps   . Dyspnea   . Dysphagia   . Pacemaker   . A-fib     Past Surgical History  Procedure Laterality Date  . Cystectomy  1970    benign breast cyst removed  . Pacemaker insertion  01/09/11    by Fawn Kirk for tachycardia/ bradycardia syndrome  . Cataract extraction    . Blepharoplasty  2013  . Endometrial biopsy      Current Outpatient Prescriptions  Medication Sig Dispense Refill   . Calcium-Magnesium (CAL-MAG PO) Take by mouth.    . Cholecalciferol (VITAMIN D3 PO) Take 800 capsules by mouth daily.      . Fish Oil OIL by Does not apply route daily.      . flecainide (TAMBOCOR) 50 MG tablet TAKE 1 AND 1/2 TABLET BY MOUTH TWICE A DAY  12  . MAGNESIUM CITRATE PO Take by mouth.    . Multiple Vitamins-Minerals (ICAPS) TABS Take by mouth daily.      . Potassium 99 MG TABS Take by mouth.    . Probiotic Product (PROBIOTIC PO) Take by mouth.    . vitamin B-12 (CYANOCOBALAMIN) 100 MCG tablet Take 100 mcg by mouth daily.    Carlena Hurl 20 MG TABS tablet      No current facility-administered medications for this visit.    Family History  Problem Relation Age of Onset  . Coronary artery disease Father   . Coronary artery disease Mother   . Stroke Brother 71  . Diabetes Sister   . Breast cancer Sister   . Coronary artery disease Sister     CABG    Review of Systems  Constitutional: Negative.   HENT: Positive for hearing loss.   Eyes: Negative.   Respiratory: Negative.   Cardiovascular: Positive for palpitations.  Gastrointestinal: Positive for constipation.       Change in stool  Endocrine: Negative.   Genitourinary: Negative.        Night urination   Musculoskeletal: Positive for myalgias.  Skin: Negative.   Allergic/Immunologic: Negative.   Neurological: Negative.   Psychiatric/Behavioral: Negative.     Exam:   BP 130/80 mmHg  Pulse 78  Resp 16  Ht  (1.448 m)  Wt 172 lb (78.019 kg)  BMI 37.21 kg/m2  Weight change: @ Height:   Height:  (144.8 cm)  Ht Readings from Last 3 Encounters:  09/09/15  (1.448 m)  06/18/15  (1.473 m)  08/07/14 4' 10.75" (1.492 m)    General appearance: alert, cooperative and appears stated age Head: Normocephalic, without obvious abnormality, atraumatic Neck: no adenopathy, supple, symmetrical, trachea midline and thyroid normal to inspection and palpation Lungs: clear to auscultation  bilaterally Breasts: normal appearance, no masses or tenderness Heart: regular rate and rhythm Abdomen: soft, non-tender; bowel sounds normal; no masses,  no organomegaly Extremities: extremities normal, atraumatic, no cyanosis or edema Skin: Skin color, texture, turgor normal. No rashes or lesions Lymph nodes: Cervical, supraclavicular, and axillary nodes normal. No abnormal inguinal nodes palpated Neurologic: Grossly normal   Pelvic: External genitalia:  no lesions              Urethra:  normal appearing urethra with no masses, tenderness or lesions              Bartholins and Skenes: normal                 Vagina: normal appearing vagina with normal color and discharge, no lesions. Mild atrophy              Cervix: no lesions               Bimanual Exam:  Uterus:  normal size, contour, position, consistency, mobility, non-tender              Adnexa: no mass, fullness, tenderness               Rectovaginal: Confirms               Anus:  normal sphincter tone, no lesions  Chaperone was present for exam.  A:  Well Woman with normal exam  H/O osteopenia, declines treatment, doesn't want another DEXA  Overweight  P:   Pap with hpv (if normal she won't do one again)  Mammogram in 11/16  Colonoscopy next year  DEXA with her primary, declines for now  Continue calcium and vit D  Labs with primary MD  Having a sleep apnea test  Discussed weight watchers and exercise for weight loss

## 2015-09-11 LAB — IPS PAP TEST WITH HPV

## 2015-09-12 ENCOUNTER — Telehealth: Payer: Self-pay | Admitting: Neurology

## 2015-09-12 DIAGNOSIS — G4733 Obstructive sleep apnea (adult) (pediatric): Secondary | ICD-10-CM

## 2015-09-12 NOTE — Telephone Encounter (Signed)
Patient referred by Dr. Eloise Harman, seen by me on 06/18/15, PSG on 08/05/15, CPAP titration on 09/09/15. Ins: [employer]na Medicare (?).  Please call and inform patient that I have entered an order for treatment with positive airway pressure (PAP) treatment of obstructive sleep apnea (OSA). She did well during the latest sleep study with CPAP. We will, therefore, arrange for a machine for home use through a DME (durable medical equipment) company of Her choice; and I will see the patient back in follow-up in about 8-10 weeks. Please also explain to the patient that I will be looking out for compliance data, which can be downloaded from the machine (stored on an SD card, that is inserted in the machine) or via remote access through a modem, that is built into the machine. At the time of the followup appointment we will discuss sleep study results and how it is going with PAP treatment at home. Please advise patient to bring Her machine at the time of the first FU visit, even though this is cumbersome. Bringing the machine for every visit after that will likely not be needed, but often helps for the first visit to troubleshoot if needed. Please re-enforce the importance of compliance with treatment and the need for [place] to monitor compliance data - often an insurance requirement and actually good feedback for the patient as far as how they are doing.  Also remind patient, that any interim PAP machine or mask issues should be first addressed with the DME company, as they can often help better with technical and mask fit issues. Please ask if patient has a preference regarding DME company.  Please also make sure, the patient has a follow-up appointment with me in about 8-10 weeks from the setup date, thanks.  Once you have spoken to the patient - and faxed/routed report to PCP and referring MD (if other than PCP), you can close this encounter, thanks,   Huston Foley, MD, PhD [facility] ([facility])

## 2015-09-16 NOTE — Telephone Encounter (Signed)
Left message to call back  

## 2015-09-25 NOTE — Telephone Encounter (Signed)
I spoke to patient and gave her results of study and recommendation. She would like to proceed with treatment. I have contacted Jamie Morales with [employer] to see if they can service the patient. I will fax report to PCP. I will also send letter to patient to remind her to make an appt with Dr. Frances Furbish and stress the importance of compliance.

## 2015-10-14 ENCOUNTER — Telehealth: Payer: Self-pay | Admitting: Neurology

## 2015-10-14 NOTE — Telephone Encounter (Signed)
Patient left message on sleep lab phone for information concerning picking up her CPAP equipment.  She doesn't remember when her appointment was or who it was with.  Please give the patient a call to let her know when and where to go?

## 2015-10-15 NOTE — Telephone Encounter (Signed)
I spoke to patient and she states that she found the name and number that she was looking for.

## 2016-09-24 ENCOUNTER — Encounter: Payer: Self-pay | Admitting: Obstetrics and Gynecology

## 2016-09-28 ENCOUNTER — Ambulatory Visit: Payer: Medicare HMO | Admitting: Obstetrics and Gynecology

## 2016-11-16 ENCOUNTER — Encounter: Payer: Self-pay | Admitting: Obstetrics and Gynecology

## 2017-09-13 ENCOUNTER — Other Ambulatory Visit: Payer: Self-pay | Admitting: Gastroenterology

## 2017-09-13 DIAGNOSIS — R1310 Dysphagia, unspecified: Secondary | ICD-10-CM

## 2017-09-15 ENCOUNTER — Other Ambulatory Visit: Payer: Self-pay

## 2017-09-23 ENCOUNTER — Ambulatory Visit
Admission: RE | Admit: 2017-09-23 | Discharge: 2017-09-23 | Disposition: A | Discharge status: Home / Self Care | Payer: Medicare Other | Source: Ambulatory Visit | Attending: Gastroenterology | Admitting: Gastroenterology

## 2017-09-23 DIAGNOSIS — R1310 Dysphagia, unspecified: Secondary | ICD-10-CM

## 2017-10-04 ENCOUNTER — Other Ambulatory Visit: Payer: Self-pay

## 2018-01-24 DIAGNOSIS — I348 Other nonrheumatic mitral valve disorders: Secondary | ICD-10-CM | POA: Diagnosis not present

## 2018-01-24 DIAGNOSIS — I480 Paroxysmal atrial fibrillation: Secondary | ICD-10-CM | POA: Diagnosis not present

## 2018-01-24 DIAGNOSIS — I495 Sick sinus syndrome: Secondary | ICD-10-CM | POA: Diagnosis not present

## 2018-01-24 DIAGNOSIS — Z7901 Long term (current) use of anticoagulants: Secondary | ICD-10-CM | POA: Diagnosis not present

## 2018-01-24 DIAGNOSIS — K219 Gastro-esophageal reflux disease without esophagitis: Secondary | ICD-10-CM | POA: Diagnosis not present

## 2018-01-24 DIAGNOSIS — E668 Other obesity: Secondary | ICD-10-CM | POA: Diagnosis not present

## 2018-01-24 DIAGNOSIS — E785 Hyperlipidemia, unspecified: Secondary | ICD-10-CM | POA: Diagnosis not present

## 2018-01-24 DIAGNOSIS — Z950 Presence of cardiac pacemaker: Secondary | ICD-10-CM | POA: Diagnosis not present

## 2018-01-28 DIAGNOSIS — R110 Nausea: Secondary | ICD-10-CM | POA: Diagnosis not present

## 2018-01-28 DIAGNOSIS — I4891 Unspecified atrial fibrillation: Secondary | ICD-10-CM | POA: Diagnosis not present

## 2018-01-28 DIAGNOSIS — K219 Gastro-esophageal reflux disease without esophagitis: Secondary | ICD-10-CM | POA: Diagnosis not present

## 2018-01-28 DIAGNOSIS — R0789 Other chest pain: Secondary | ICD-10-CM | POA: Diagnosis not present

## 2018-01-28 DIAGNOSIS — R197 Diarrhea, unspecified: Secondary | ICD-10-CM | POA: Diagnosis not present

## 2018-01-28 DIAGNOSIS — R42 Dizziness and giddiness: Secondary | ICD-10-CM | POA: Diagnosis not present

## 2018-01-28 DIAGNOSIS — I495 Sick sinus syndrome: Secondary | ICD-10-CM | POA: Diagnosis not present

## 2018-01-28 DIAGNOSIS — R51 Headache: Secondary | ICD-10-CM | POA: Diagnosis not present

## 2018-01-28 DIAGNOSIS — R1030 Lower abdominal pain, unspecified: Secondary | ICD-10-CM | POA: Diagnosis not present

## 2018-01-28 DIAGNOSIS — Z9989 Dependence on other enabling machines and devices: Secondary | ICD-10-CM | POA: Diagnosis not present

## 2018-01-28 DIAGNOSIS — I10 Essential (primary) hypertension: Secondary | ICD-10-CM | POA: Diagnosis not present

## 2018-01-29 DIAGNOSIS — R42 Dizziness and giddiness: Secondary | ICD-10-CM | POA: Diagnosis not present

## 2018-01-29 DIAGNOSIS — R1030 Lower abdominal pain, unspecified: Secondary | ICD-10-CM | POA: Diagnosis not present

## 2018-01-29 DIAGNOSIS — R197 Diarrhea, unspecified: Secondary | ICD-10-CM | POA: Diagnosis not present

## 2018-01-29 DIAGNOSIS — K219 Gastro-esophageal reflux disease without esophagitis: Secondary | ICD-10-CM | POA: Diagnosis not present

## 2018-01-29 DIAGNOSIS — I495 Sick sinus syndrome: Secondary | ICD-10-CM | POA: Diagnosis not present

## 2018-01-29 DIAGNOSIS — R0789 Other chest pain: Secondary | ICD-10-CM | POA: Diagnosis not present

## 2018-01-29 DIAGNOSIS — R110 Nausea: Secondary | ICD-10-CM | POA: Diagnosis not present

## 2018-01-29 DIAGNOSIS — I10 Essential (primary) hypertension: Secondary | ICD-10-CM | POA: Diagnosis not present

## 2018-01-29 DIAGNOSIS — R51 Headache: Secondary | ICD-10-CM | POA: Diagnosis not present

## 2018-01-29 DIAGNOSIS — I4891 Unspecified atrial fibrillation: Secondary | ICD-10-CM | POA: Diagnosis not present

## 2018-02-08 DIAGNOSIS — R202 Paresthesia of skin: Secondary | ICD-10-CM | POA: Diagnosis not present

## 2018-02-08 DIAGNOSIS — I10 Essential (primary) hypertension: Secondary | ICD-10-CM | POA: Diagnosis not present

## 2018-02-08 DIAGNOSIS — R5383 Other fatigue: Secondary | ICD-10-CM | POA: Diagnosis not present

## 2018-02-08 DIAGNOSIS — I4891 Unspecified atrial fibrillation: Secondary | ICD-10-CM | POA: Diagnosis not present

## 2018-02-11 DIAGNOSIS — I10 Essential (primary) hypertension: Secondary | ICD-10-CM | POA: Diagnosis not present

## 2018-02-11 DIAGNOSIS — I4891 Unspecified atrial fibrillation: Secondary | ICD-10-CM | POA: Diagnosis not present

## 2018-02-17 DIAGNOSIS — I10 Essential (primary) hypertension: Secondary | ICD-10-CM | POA: Diagnosis not present

## 2018-02-17 DIAGNOSIS — I4891 Unspecified atrial fibrillation: Secondary | ICD-10-CM | POA: Diagnosis not present

## 2018-02-17 DIAGNOSIS — Z86010 Personal history of colonic polyps: Secondary | ICD-10-CM | POA: Diagnosis not present

## 2018-02-17 DIAGNOSIS — Z7901 Long term (current) use of anticoagulants: Secondary | ICD-10-CM | POA: Diagnosis not present

## 2018-02-17 DIAGNOSIS — K219 Gastro-esophageal reflux disease without esophagitis: Secondary | ICD-10-CM | POA: Diagnosis not present

## 2018-02-18 DIAGNOSIS — H524 Presbyopia: Secondary | ICD-10-CM | POA: Diagnosis not present

## 2018-02-18 DIAGNOSIS — Z961 Presence of intraocular lens: Secondary | ICD-10-CM | POA: Diagnosis not present

## 2018-02-18 DIAGNOSIS — H353132 Nonexudative age-related macular degeneration, bilateral, intermediate dry stage: Secondary | ICD-10-CM | POA: Diagnosis not present

## 2018-02-18 DIAGNOSIS — H5213 Myopia, bilateral: Secondary | ICD-10-CM | POA: Diagnosis not present

## 2018-04-26 DIAGNOSIS — Z950 Presence of cardiac pacemaker: Secondary | ICD-10-CM | POA: Diagnosis not present

## 2018-04-26 DIAGNOSIS — E668 Other obesity: Secondary | ICD-10-CM | POA: Diagnosis not present

## 2018-04-26 DIAGNOSIS — Z7901 Long term (current) use of anticoagulants: Secondary | ICD-10-CM | POA: Diagnosis not present

## 2018-04-26 DIAGNOSIS — I480 Paroxysmal atrial fibrillation: Secondary | ICD-10-CM | POA: Diagnosis not present

## 2018-04-26 DIAGNOSIS — I348 Other nonrheumatic mitral valve disorders: Secondary | ICD-10-CM | POA: Diagnosis not present

## 2018-04-26 DIAGNOSIS — E785 Hyperlipidemia, unspecified: Secondary | ICD-10-CM | POA: Diagnosis not present

## 2018-04-26 DIAGNOSIS — I495 Sick sinus syndrome: Secondary | ICD-10-CM | POA: Diagnosis not present

## 2018-04-26 DIAGNOSIS — K219 Gastro-esophageal reflux disease without esophagitis: Secondary | ICD-10-CM | POA: Diagnosis not present

## 2018-05-14 IMAGING — RF DG ESOPHAGUS
7 of 8 series · 14 of 24 positions shown · non-contrast
Comparison: None in PACs

CLINICAL DATA: Dysphagia, reflux symptoms.

EXAM:
ESOPHOGRAM / BARIUM SWALLOW / BARIUM TABLET STUDY
TECHNIQUE: Combined double contrast and single contrast examination performed
using effervescent crystals, thick barium liquid, and thin barium
liquid. The patient was observed with fluoroscopy swallowing a 13 mm
barium sulphate tablet.
FLUOROSCOPY TIME:  Fluoroscopy Time:  1 minutes, 18 seconds
Radiation Exposure Index (if provided by the fluoroscopic device):
86 mGy a
Number of Acquired Spot Images: 7+ 5 video loops.

[Series 1: sequence · 2 of 4 frames shown (1 of 5)]
[frame 1/4]
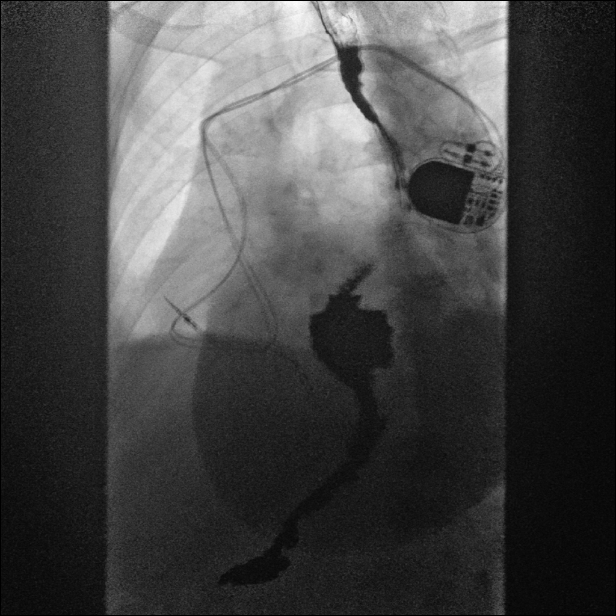
[frame 4/4]
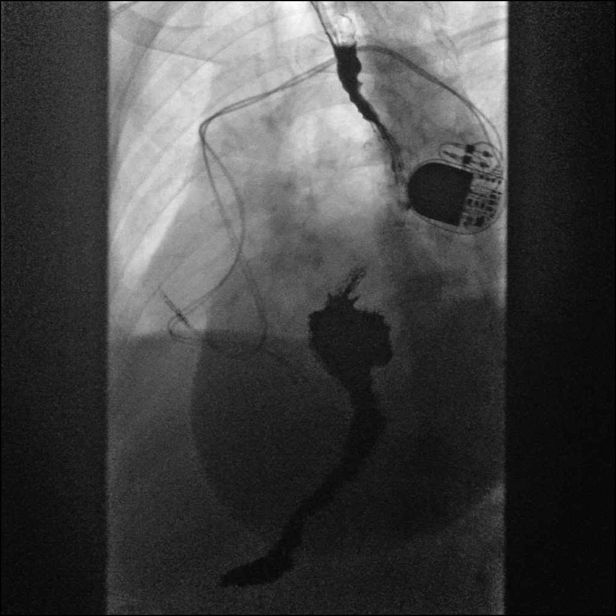

[Series 2: one shot · 1 of 4 slices shown (1 of 2)]
[im 2/4]
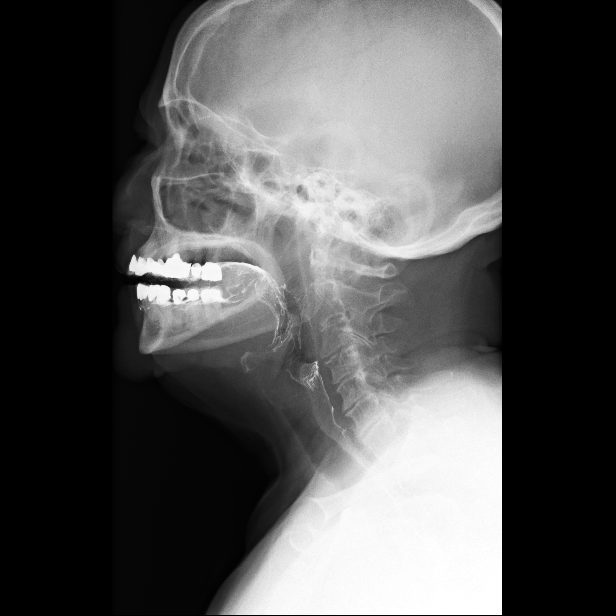

[Series 3: sequence · 3 of 82 frames shown (2 of 5)]
[frame 13/82]
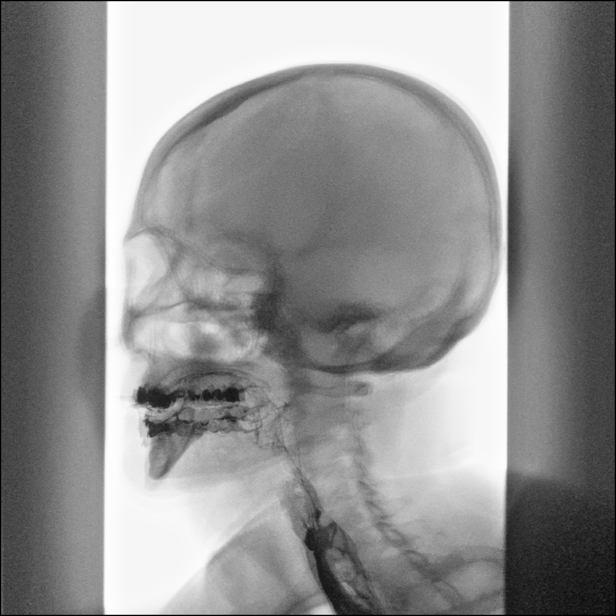
[frame 29/82]
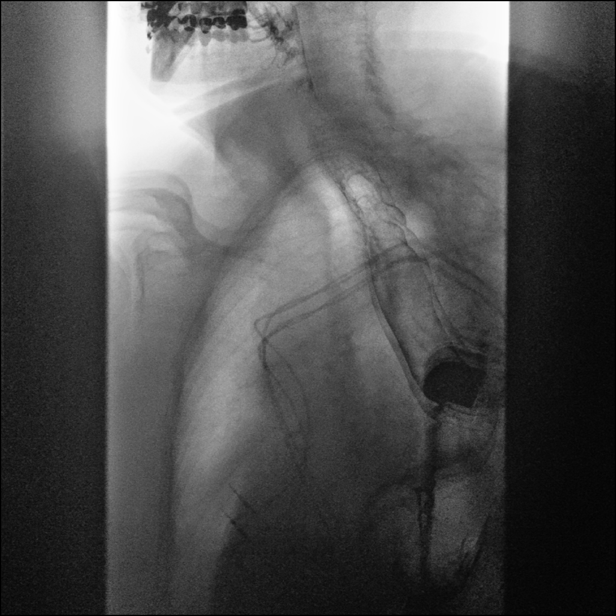
[frame 70/82]
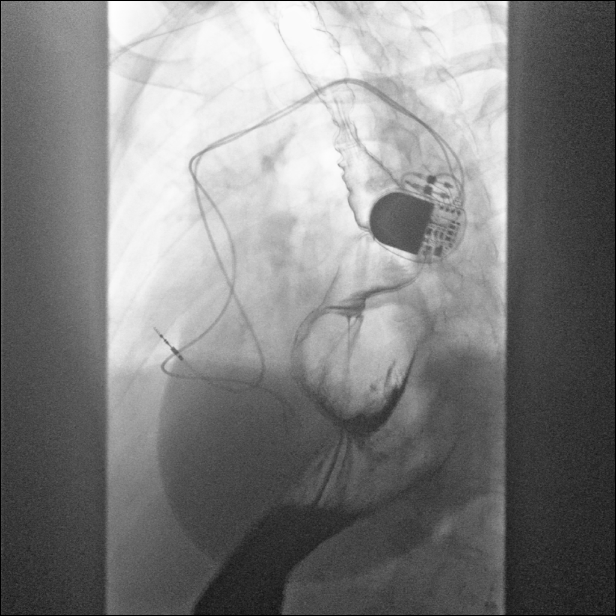

[Series 5: sequence · 3 of 87 frames shown (3 of 5)]
[frame 14/87]
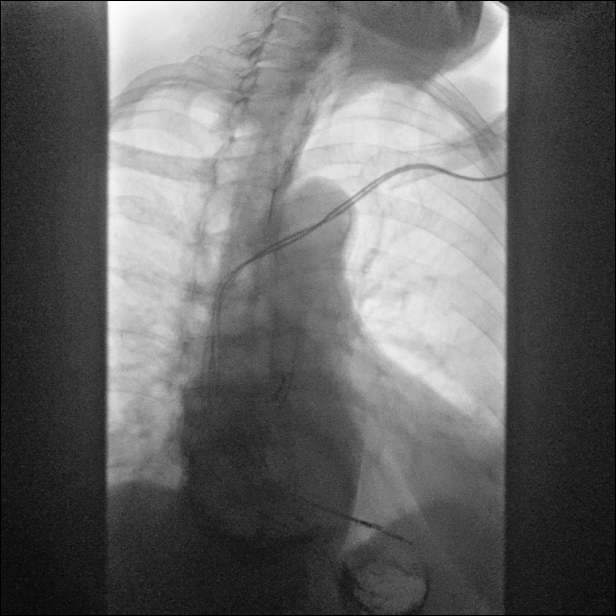
[frame 44/87]
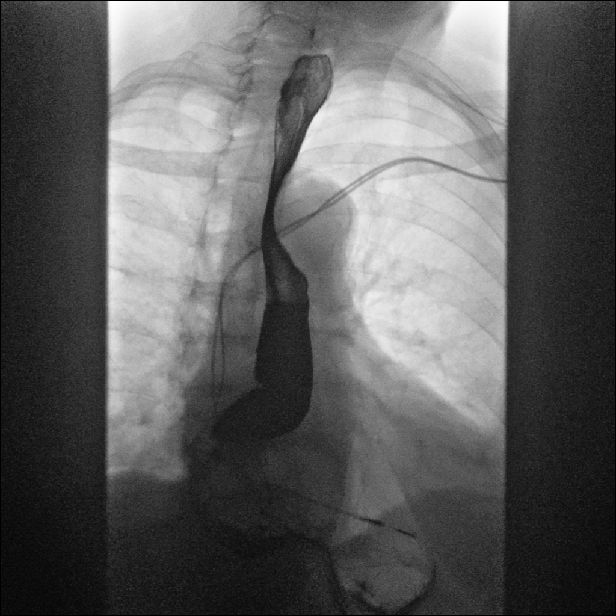
[frame 74/87]
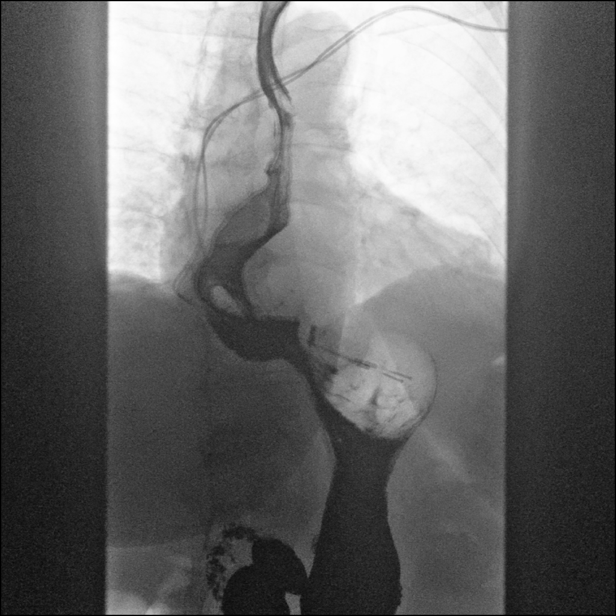

[Series 6: sequence · 1 of 82 frames shown (4 of 5)]
[frame 30/82]
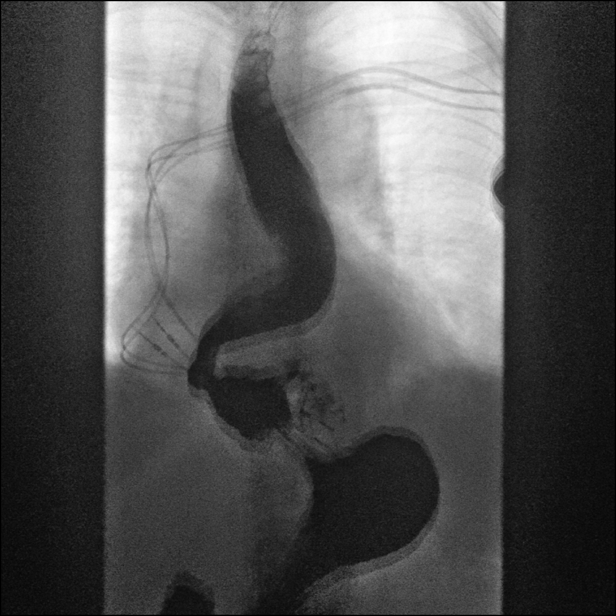

[Series 7: one shot · 2 of 2 slices shown (2 of 2)]
[im 1/2]
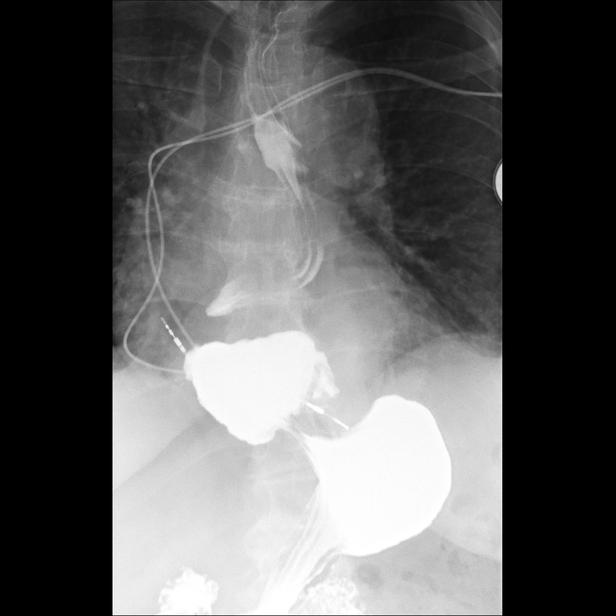
[im 2/2]
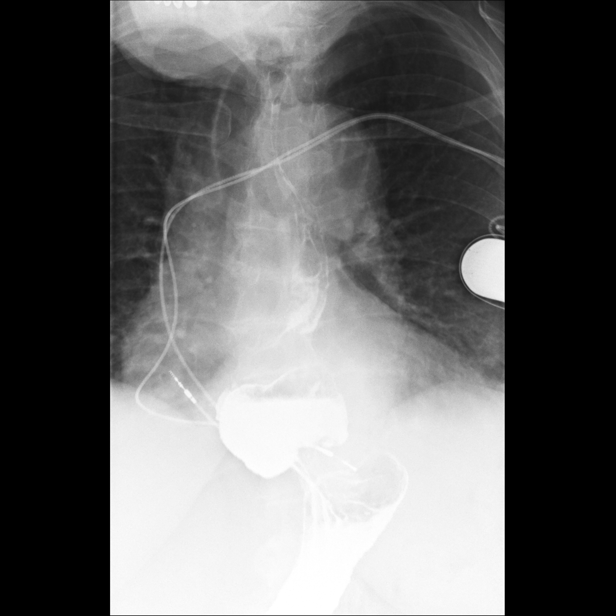

[Series 8: sequence · 2 of 38 frames shown (5 of 5)]
[frame 15/38]
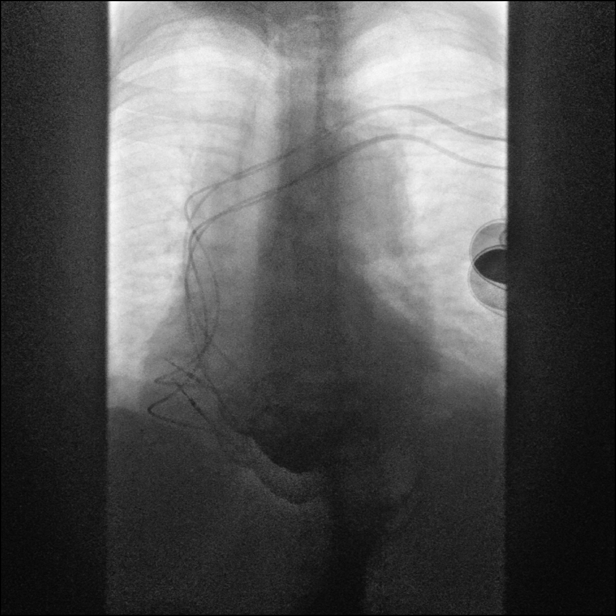
[frame 33/38]
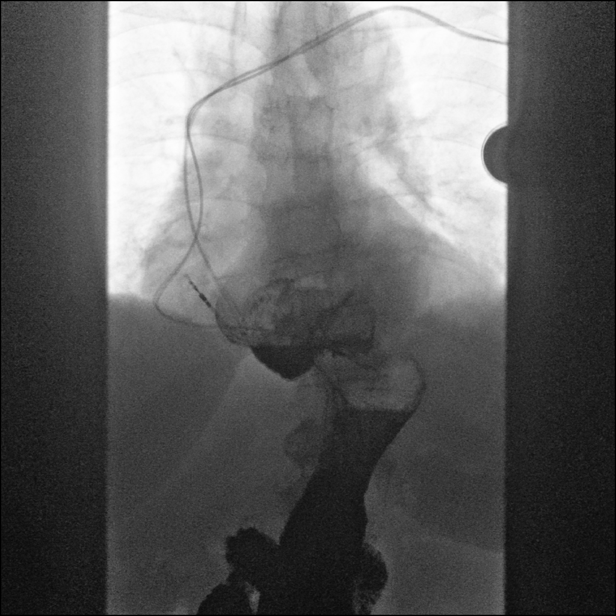

[14 of 24 positions shown; findings below may reference images not displayed]

FINDINGS: The patient ingested thick and thin barium and the gas-forming
crystals without difficulty. The hypopharynx distended well. There
was no laryngeal penetration of the barium. A prominent
cricopharyngeus muscle impression was noted intermittently in the
cervical esophagus. The thoracic esophagus distended well. Prominent
tertiary contractions were observed intermittently. A large non
reducible hiatal hernia was demonstrated. The GE junction appeared
widely patent to both barium and the barium tablet. There was no
evidence of ulceration or mass. The observed portions of the stomach
were normal.
IMPRESSION: Large non reducible hiatal hernia.  Mild changes of presbyesophagus.

Prominent cricopharyngeus muscle impression likely reflects chronic
reflux although no reflux was observed during today's study.

Normal passage of the barium tablet.

No evidence of ulceration or mass.

## 2018-06-07 DIAGNOSIS — I4891 Unspecified atrial fibrillation: Secondary | ICD-10-CM | POA: Diagnosis not present

## 2018-06-07 DIAGNOSIS — I10 Essential (primary) hypertension: Secondary | ICD-10-CM | POA: Diagnosis not present

## 2018-06-10 DIAGNOSIS — B029 Zoster without complications: Secondary | ICD-10-CM | POA: Diagnosis not present

## 2018-06-10 DIAGNOSIS — H6691 Otitis media, unspecified, right ear: Secondary | ICD-10-CM | POA: Diagnosis not present

## 2018-07-26 DIAGNOSIS — I480 Paroxysmal atrial fibrillation: Secondary | ICD-10-CM | POA: Diagnosis not present

## 2018-07-26 DIAGNOSIS — E668 Other obesity: Secondary | ICD-10-CM | POA: Diagnosis not present

## 2018-07-26 DIAGNOSIS — Z950 Presence of cardiac pacemaker: Secondary | ICD-10-CM | POA: Diagnosis not present

## 2018-07-26 DIAGNOSIS — E785 Hyperlipidemia, unspecified: Secondary | ICD-10-CM | POA: Diagnosis not present

## 2018-07-26 DIAGNOSIS — I495 Sick sinus syndrome: Secondary | ICD-10-CM | POA: Diagnosis not present

## 2018-07-26 DIAGNOSIS — Z7901 Long term (current) use of anticoagulants: Secondary | ICD-10-CM | POA: Diagnosis not present

## 2018-07-26 DIAGNOSIS — I348 Other nonrheumatic mitral valve disorders: Secondary | ICD-10-CM | POA: Diagnosis not present

## 2018-07-26 DIAGNOSIS — K219 Gastro-esophageal reflux disease without esophagitis: Secondary | ICD-10-CM | POA: Diagnosis not present

## 2018-10-03 DIAGNOSIS — R7301 Impaired fasting glucose: Secondary | ICD-10-CM | POA: Diagnosis not present

## 2018-10-03 DIAGNOSIS — E7849 Other hyperlipidemia: Secondary | ICD-10-CM | POA: Diagnosis not present

## 2018-10-03 DIAGNOSIS — R82998 Other abnormal findings in urine: Secondary | ICD-10-CM | POA: Diagnosis not present

## 2018-10-03 DIAGNOSIS — I10 Essential (primary) hypertension: Secondary | ICD-10-CM | POA: Diagnosis not present

## 2018-10-17 ENCOUNTER — Ambulatory Visit: Payer: Medicare Other | Admitting: Cardiology

## 2018-10-17 DIAGNOSIS — I10 Essential (primary) hypertension: Secondary | ICD-10-CM | POA: Diagnosis not present

## 2018-10-17 DIAGNOSIS — Z23 Encounter for immunization: Secondary | ICD-10-CM | POA: Diagnosis not present

## 2018-10-17 DIAGNOSIS — I2510 Atherosclerotic heart disease of native coronary artery without angina pectoris: Secondary | ICD-10-CM | POA: Diagnosis not present

## 2018-10-17 DIAGNOSIS — G4733 Obstructive sleep apnea (adult) (pediatric): Secondary | ICD-10-CM | POA: Diagnosis not present

## 2018-10-17 DIAGNOSIS — I480 Paroxysmal atrial fibrillation: Secondary | ICD-10-CM | POA: Diagnosis not present

## 2018-10-17 DIAGNOSIS — Z0000 Encounter for general adult medical examination without abnormal findings: Secondary | ICD-10-CM | POA: Diagnosis not present

## 2018-10-17 DIAGNOSIS — E7849 Other hyperlipidemia: Secondary | ICD-10-CM | POA: Diagnosis not present

## 2018-10-17 DIAGNOSIS — H9201 Otalgia, right ear: Secondary | ICD-10-CM | POA: Diagnosis not present

## 2018-10-17 DIAGNOSIS — R7301 Impaired fasting glucose: Secondary | ICD-10-CM | POA: Diagnosis not present

## 2018-10-25 ENCOUNTER — Encounter: Payer: Self-pay | Admitting: *Deleted

## 2018-10-25 ENCOUNTER — Telehealth: Payer: Self-pay

## 2018-10-25 NOTE — Telephone Encounter (Signed)
LMOVM reminding pt to send remote transmission.   

## 2018-10-31 ENCOUNTER — Encounter: Payer: Self-pay | Admitting: Cardiology

## 2018-11-02 ENCOUNTER — Ambulatory Visit (INDEPENDENT_AMBULATORY_CARE_PROVIDER_SITE_OTHER): Payer: Self-pay | Admitting: *Deleted

## 2018-11-02 DIAGNOSIS — I4891 Unspecified atrial fibrillation: Secondary | ICD-10-CM

## 2018-11-02 DIAGNOSIS — I495 Sick sinus syndrome: Secondary | ICD-10-CM

## 2018-11-02 NOTE — Progress Notes (Signed)
Remote pacemaker transmission.   

## 2018-11-04 DIAGNOSIS — Z1212 Encounter for screening for malignant neoplasm of rectum: Secondary | ICD-10-CM | POA: Diagnosis not present

## 2018-12-27 DIAGNOSIS — I4891 Unspecified atrial fibrillation: Secondary | ICD-10-CM | POA: Diagnosis not present

## 2018-12-27 DIAGNOSIS — I10 Essential (primary) hypertension: Secondary | ICD-10-CM | POA: Diagnosis not present

## 2019-01-01 LAB — CUP PACEART REMOTE DEVICE CHECK
Battery Impedance: 695 Ohm
Battery Voltage: 2.78 V
Brady Statistic AP VP Percent: 0 %
Brady Statistic AP VS Percent: 99 %
Brady Statistic AS VP Percent: 0 %
Implantable Lead Implant Date: 20120121
Implantable Lead Location: 753859
Implantable Lead Model: 4092
Implantable Lead Model: 5076
Lead Channel Impedance Value: 765 Ohm
Lead Channel Pacing Threshold Amplitude: 0.625 V
Lead Channel Pacing Threshold Pulse Width: 0.4 ms
Lead Channel Setting Pacing Amplitude: 2.5 V
Lead Channel Setting Pacing Pulse Width: 0.4 ms
MDC IDC LEAD IMPLANT DT: 20120121
MDC IDC LEAD LOCATION: 753860
MDC IDC MSMT BATTERY REMAINING LONGEVITY: 73 mo
MDC IDC MSMT LEADCHNL RA IMPEDANCE VALUE: 391 Ohm
MDC IDC MSMT LEADCHNL RV PACING THRESHOLD AMPLITUDE: 0.625 V
MDC IDC MSMT LEADCHNL RV PACING THRESHOLD PULSEWIDTH: 0.4 ms
MDC IDC PG IMPLANT DT: 20120121
MDC IDC SESS DTM: 20191113130650
MDC IDC SET LEADCHNL RA PACING AMPLITUDE: 2 V
MDC IDC SET LEADCHNL RV SENSING SENSITIVITY: 4 mV
MDC IDC STAT BRADY AS VS PERCENT: 1 %

## 2019-01-03 ENCOUNTER — Telehealth: Payer: Self-pay | Admitting: Cardiology

## 2019-01-03 NOTE — Telephone Encounter (Signed)
Attempted to call pt to confirm the ok to release of carelink information to [facility]. No answer and unable to leave a message on either number.

## 2019-01-06 NOTE — Telephone Encounter (Signed)
2nd attempt  LMOVM for pt to return call.  

## 2019-01-20 NOTE — Telephone Encounter (Signed)
3rd attempt  LMOVM for pt to return call. I will release in carelink profile to [facility] cardi[facility].

## 2019-02-07 ENCOUNTER — Other Ambulatory Visit: Payer: Self-pay | Admitting: Cardiology

## 2019-02-07 MED ORDER — RIVAROXABAN 20 MG PO TABS: 20.0000 mg | ORAL_TABLET | Freq: Every day | ORAL | 0 refills | Status: AC

## 2019-02-07 NOTE — Telephone Encounter (Signed)
30 day supply has been sent to the pharmacy. Patient needs to schedule a follow up as she has not been seen by Dr. Donnie Aho since 09/2017. Will have the the front desk to schedule an appointment.

## 2019-02-07 NOTE — Telephone Encounter (Signed)
° °  1. Which medications need to be refilled? (please list name of each medication and dose if known) Xarelto 20mg  tablet once daily  2. Which pharmacy/location (including street and city if local pharmacy) is medication to be sent to? [facility] [phone]  3. Do they need a 30 day or 90 day supply? 30

## 2019-06-15 ENCOUNTER — Other Ambulatory Visit: Payer: Self-pay | Admitting: Cardiology

## 2019-06-15 NOTE — Telephone Encounter (Signed)
°*  STAT* If patient is at the pharmacy, call can be transferred to refill team.   1. Which medications need to be refilled? (please list name of each medication and dose if known) Flecainide acetate 50mg  tablet twice or three times a day  2. Which pharmacy/location (including street and city if local pharmacy) is medication to be sent to? [facility]  3. Do they need a 30 day or 90 day supply? [place]

## 2019-06-16 NOTE — Addendum Note (Signed)
Addended by: Polly Cobia A on: 06/16/2019 10:06 AM   Modules accepted: Orders

## 2019-06-16 NOTE — Telephone Encounter (Signed)
Patient has not been seen by Dr. Bettina Gavia. Refill request denied.

## 2019-06-16 NOTE — Addendum Note (Signed)
Addended by: Polly Cobia A on: 06/16/2019 09:31 AM   Modules accepted: Orders

## 2019-06-16 NOTE — Telephone Encounter (Signed)
Left voice message for patient to return call to clarify medication dose. Has not been seen in our office, had appointment  to see Dr. Bettina Gavia in 09/2018 which was cancelled by patient.

## 2019-07-24 DIAGNOSIS — I480 Paroxysmal atrial fibrillation: Secondary | ICD-10-CM | POA: Diagnosis not present

## 2019-07-24 DIAGNOSIS — I10 Essential (primary) hypertension: Secondary | ICD-10-CM | POA: Diagnosis not present

## 2019-07-24 DIAGNOSIS — R200 Anesthesia of skin: Secondary | ICD-10-CM | POA: Diagnosis not present

## 2019-08-17 DIAGNOSIS — Z1159 Encounter for screening for other viral diseases: Secondary | ICD-10-CM | POA: Diagnosis not present

## 2019-08-22 DIAGNOSIS — Z950 Presence of cardiac pacemaker: Secondary | ICD-10-CM | POA: Diagnosis not present

## 2019-08-22 DIAGNOSIS — I4891 Unspecified atrial fibrillation: Secondary | ICD-10-CM | POA: Diagnosis not present

## 2019-08-22 DIAGNOSIS — I10 Essential (primary) hypertension: Secondary | ICD-10-CM | POA: Diagnosis not present

## 2019-09-13 DIAGNOSIS — R69 Illness, unspecified: Secondary | ICD-10-CM | POA: Diagnosis not present

## 2019-10-30 DIAGNOSIS — E7849 Other hyperlipidemia: Secondary | ICD-10-CM | POA: Diagnosis not present

## 2019-10-30 DIAGNOSIS — R7301 Impaired fasting glucose: Secondary | ICD-10-CM | POA: Diagnosis not present

## 2019-11-03 DIAGNOSIS — R69 Illness, unspecified: Secondary | ICD-10-CM | POA: Diagnosis not present

## 2019-11-03 DIAGNOSIS — H9193 Unspecified hearing loss, bilateral: Secondary | ICD-10-CM | POA: Diagnosis not present

## 2019-11-03 DIAGNOSIS — H9203 Otalgia, bilateral: Secondary | ICD-10-CM | POA: Diagnosis not present

## 2019-11-06 DIAGNOSIS — R82998 Other abnormal findings in urine: Secondary | ICD-10-CM | POA: Diagnosis not present

## 2019-11-06 DIAGNOSIS — I480 Paroxysmal atrial fibrillation: Secondary | ICD-10-CM | POA: Diagnosis not present

## 2019-11-06 DIAGNOSIS — R200 Anesthesia of skin: Secondary | ICD-10-CM | POA: Diagnosis not present

## 2019-11-06 DIAGNOSIS — Z0000 Encounter for general adult medical examination without abnormal findings: Secondary | ICD-10-CM | POA: Diagnosis not present

## 2019-11-06 DIAGNOSIS — R7301 Impaired fasting glucose: Secondary | ICD-10-CM | POA: Diagnosis not present

## 2019-11-06 DIAGNOSIS — I10 Essential (primary) hypertension: Secondary | ICD-10-CM | POA: Diagnosis not present

## 2019-11-06 DIAGNOSIS — G4733 Obstructive sleep apnea (adult) (pediatric): Secondary | ICD-10-CM | POA: Diagnosis not present

## 2019-11-06 DIAGNOSIS — R32 Unspecified urinary incontinence: Secondary | ICD-10-CM | POA: Diagnosis not present

## 2019-11-06 DIAGNOSIS — E785 Hyperlipidemia, unspecified: Secondary | ICD-10-CM | POA: Diagnosis not present

## 2019-11-06 DIAGNOSIS — H9193 Unspecified hearing loss, bilateral: Secondary | ICD-10-CM | POA: Diagnosis not present

## 2019-11-06 DIAGNOSIS — I2510 Atherosclerotic heart disease of native coronary artery without angina pectoris: Secondary | ICD-10-CM | POA: Diagnosis not present

## 2019-11-29 DIAGNOSIS — I4891 Unspecified atrial fibrillation: Secondary | ICD-10-CM | POA: Diagnosis not present

## 2019-11-29 DIAGNOSIS — Z950 Presence of cardiac pacemaker: Secondary | ICD-10-CM | POA: Diagnosis not present

## 2019-12-20 DIAGNOSIS — H9203 Otalgia, bilateral: Secondary | ICD-10-CM | POA: Diagnosis not present

## 2019-12-20 DIAGNOSIS — H903 Sensorineural hearing loss, bilateral: Secondary | ICD-10-CM | POA: Diagnosis not present

## 2019-12-20 DIAGNOSIS — H9193 Unspecified hearing loss, bilateral: Secondary | ICD-10-CM | POA: Diagnosis not present

## 2019-12-20 DIAGNOSIS — R69 Illness, unspecified: Secondary | ICD-10-CM | POA: Diagnosis not present

## 2020-01-24 DIAGNOSIS — Z1159 Encounter for screening for other viral diseases: Secondary | ICD-10-CM | POA: Diagnosis not present

## 2020-02-05 DIAGNOSIS — I4891 Unspecified atrial fibrillation: Secondary | ICD-10-CM | POA: Diagnosis not present

## 2020-02-05 DIAGNOSIS — G4730 Sleep apnea, unspecified: Secondary | ICD-10-CM | POA: Diagnosis not present

## 2020-02-05 DIAGNOSIS — I10 Essential (primary) hypertension: Secondary | ICD-10-CM | POA: Diagnosis not present

## 2020-02-15 DIAGNOSIS — L309 Dermatitis, unspecified: Secondary | ICD-10-CM | POA: Diagnosis not present

## 2020-02-15 DIAGNOSIS — R21 Rash and other nonspecific skin eruption: Secondary | ICD-10-CM | POA: Diagnosis not present

## 2020-02-15 DIAGNOSIS — L299 Pruritus, unspecified: Secondary | ICD-10-CM | POA: Diagnosis not present

## 2020-02-27 DIAGNOSIS — I495 Sick sinus syndrome: Secondary | ICD-10-CM | POA: Diagnosis not present

## 2020-02-27 DIAGNOSIS — I4891 Unspecified atrial fibrillation: Secondary | ICD-10-CM | POA: Diagnosis not present

## 2020-03-18 DIAGNOSIS — I10 Essential (primary) hypertension: Secondary | ICD-10-CM | POA: Diagnosis not present

## 2020-03-18 DIAGNOSIS — G4730 Sleep apnea, unspecified: Secondary | ICD-10-CM | POA: Diagnosis not present

## 2020-03-18 DIAGNOSIS — I4891 Unspecified atrial fibrillation: Secondary | ICD-10-CM | POA: Diagnosis not present

## 2020-04-03 DIAGNOSIS — I10 Essential (primary) hypertension: Secondary | ICD-10-CM | POA: Diagnosis not present

## 2020-04-03 DIAGNOSIS — I4891 Unspecified atrial fibrillation: Secondary | ICD-10-CM | POA: Diagnosis not present

## 2020-06-12 DIAGNOSIS — Z950 Presence of cardiac pacemaker: Secondary | ICD-10-CM | POA: Diagnosis not present

## 2020-06-12 DIAGNOSIS — I4891 Unspecified atrial fibrillation: Secondary | ICD-10-CM | POA: Diagnosis not present

## 2020-06-18 DIAGNOSIS — G4730 Sleep apnea, unspecified: Secondary | ICD-10-CM | POA: Diagnosis not present

## 2020-06-18 DIAGNOSIS — I4891 Unspecified atrial fibrillation: Secondary | ICD-10-CM | POA: Diagnosis not present

## 2020-06-18 DIAGNOSIS — I10 Essential (primary) hypertension: Secondary | ICD-10-CM | POA: Diagnosis not present

## 2020-10-29 DIAGNOSIS — Z950 Presence of cardiac pacemaker: Secondary | ICD-10-CM | POA: Diagnosis not present

## 2020-10-29 DIAGNOSIS — I4891 Unspecified atrial fibrillation: Secondary | ICD-10-CM | POA: Diagnosis not present

## 2020-11-12 DIAGNOSIS — E785 Hyperlipidemia, unspecified: Secondary | ICD-10-CM | POA: Diagnosis not present

## 2020-11-12 DIAGNOSIS — R7301 Impaired fasting glucose: Secondary | ICD-10-CM | POA: Diagnosis not present

## 2020-11-19 DIAGNOSIS — K5900 Constipation, unspecified: Secondary | ICD-10-CM | POA: Diagnosis not present

## 2020-11-19 DIAGNOSIS — H9193 Unspecified hearing loss, bilateral: Secondary | ICD-10-CM | POA: Diagnosis not present

## 2020-11-19 DIAGNOSIS — R82998 Other abnormal findings in urine: Secondary | ICD-10-CM | POA: Diagnosis not present

## 2020-11-19 DIAGNOSIS — R7301 Impaired fasting glucose: Secondary | ICD-10-CM | POA: Diagnosis not present

## 2020-11-19 DIAGNOSIS — I480 Paroxysmal atrial fibrillation: Secondary | ICD-10-CM | POA: Diagnosis not present

## 2020-11-19 DIAGNOSIS — I2510 Atherosclerotic heart disease of native coronary artery without angina pectoris: Secondary | ICD-10-CM | POA: Diagnosis not present

## 2020-11-19 DIAGNOSIS — I341 Nonrheumatic mitral (valve) prolapse: Secondary | ICD-10-CM | POA: Diagnosis not present

## 2020-11-19 DIAGNOSIS — G4733 Obstructive sleep apnea (adult) (pediatric): Secondary | ICD-10-CM | POA: Diagnosis not present

## 2020-11-19 DIAGNOSIS — I10 Essential (primary) hypertension: Secondary | ICD-10-CM | POA: Diagnosis not present

## 2020-11-19 DIAGNOSIS — Z0000 Encounter for general adult medical examination without abnormal findings: Secondary | ICD-10-CM | POA: Diagnosis not present

## 2020-11-19 DIAGNOSIS — K219 Gastro-esophageal reflux disease without esophagitis: Secondary | ICD-10-CM | POA: Diagnosis not present

## 2021-01-03 DIAGNOSIS — H5213 Myopia, bilateral: Secondary | ICD-10-CM | POA: Diagnosis not present

## 2021-01-03 DIAGNOSIS — H3530 Unspecified macular degeneration: Secondary | ICD-10-CM | POA: Diagnosis not present

## 2021-01-03 DIAGNOSIS — H524 Presbyopia: Secondary | ICD-10-CM | POA: Diagnosis not present

## 2021-01-03 DIAGNOSIS — Z0100 Encounter for examination of eyes and vision without abnormal findings: Secondary | ICD-10-CM | POA: Diagnosis not present

## 2021-01-03 DIAGNOSIS — Z961 Presence of intraocular lens: Secondary | ICD-10-CM | POA: Diagnosis not present

## 2021-01-13 DIAGNOSIS — I4891 Unspecified atrial fibrillation: Secondary | ICD-10-CM | POA: Diagnosis not present

## 2021-02-10 DIAGNOSIS — I4891 Unspecified atrial fibrillation: Secondary | ICD-10-CM | POA: Diagnosis not present

## 2021-02-27 DIAGNOSIS — I495 Sick sinus syndrome: Secondary | ICD-10-CM | POA: Diagnosis not present

## 2021-05-06 DIAGNOSIS — Z950 Presence of cardiac pacemaker: Secondary | ICD-10-CM | POA: Diagnosis not present

## 2021-05-06 DIAGNOSIS — I4891 Unspecified atrial fibrillation: Secondary | ICD-10-CM | POA: Diagnosis not present

## 2021-09-22 DIAGNOSIS — I4891 Unspecified atrial fibrillation: Secondary | ICD-10-CM | POA: Diagnosis not present

## 2021-09-22 DIAGNOSIS — Z950 Presence of cardiac pacemaker: Secondary | ICD-10-CM | POA: Diagnosis not present

## 2021-11-14 DIAGNOSIS — R7301 Impaired fasting glucose: Secondary | ICD-10-CM | POA: Diagnosis not present

## 2021-11-14 DIAGNOSIS — E785 Hyperlipidemia, unspecified: Secondary | ICD-10-CM | POA: Diagnosis not present

## 2021-11-14 DIAGNOSIS — I10 Essential (primary) hypertension: Secondary | ICD-10-CM | POA: Diagnosis not present

## 2021-11-20 DIAGNOSIS — R82998 Other abnormal findings in urine: Secondary | ICD-10-CM | POA: Diagnosis not present

## 2021-11-20 DIAGNOSIS — R7301 Impaired fasting glucose: Secondary | ICD-10-CM | POA: Diagnosis not present

## 2021-11-20 DIAGNOSIS — E785 Hyperlipidemia, unspecified: Secondary | ICD-10-CM | POA: Diagnosis not present

## 2021-11-20 DIAGNOSIS — I480 Paroxysmal atrial fibrillation: Secondary | ICD-10-CM | POA: Diagnosis not present

## 2021-11-20 DIAGNOSIS — H9193 Unspecified hearing loss, bilateral: Secondary | ICD-10-CM | POA: Diagnosis not present

## 2021-11-20 DIAGNOSIS — Z23 Encounter for immunization: Secondary | ICD-10-CM | POA: Diagnosis not present

## 2021-11-20 DIAGNOSIS — Z0000 Encounter for general adult medical examination without abnormal findings: Secondary | ICD-10-CM | POA: Diagnosis not present

## 2021-11-20 DIAGNOSIS — I2510 Atherosclerotic heart disease of native coronary artery without angina pectoris: Secondary | ICD-10-CM | POA: Diagnosis not present

## 2021-11-20 DIAGNOSIS — I341 Nonrheumatic mitral (valve) prolapse: Secondary | ICD-10-CM | POA: Diagnosis not present

## 2021-11-20 DIAGNOSIS — I10 Essential (primary) hypertension: Secondary | ICD-10-CM | POA: Diagnosis not present

## 2021-11-20 DIAGNOSIS — G4733 Obstructive sleep apnea (adult) (pediatric): Secondary | ICD-10-CM | POA: Diagnosis not present

## 2023-01-06 DIAGNOSIS — I429 Cardiomyopathy, unspecified: Secondary | ICD-10-CM | POA: Diagnosis not present

## 2023-01-06 DIAGNOSIS — I480 Paroxysmal atrial fibrillation: Secondary | ICD-10-CM | POA: Diagnosis not present

## 2023-01-06 DIAGNOSIS — I2510 Atherosclerotic heart disease of native coronary artery without angina pectoris: Secondary | ICD-10-CM | POA: Diagnosis not present

## 2023-01-06 DIAGNOSIS — I495 Sick sinus syndrome: Secondary | ICD-10-CM | POA: Diagnosis not present

## 2023-01-06 DIAGNOSIS — E785 Hyperlipidemia, unspecified: Secondary | ICD-10-CM | POA: Diagnosis not present

## 2023-01-06 DIAGNOSIS — I119 Hypertensive heart disease without heart failure: Secondary | ICD-10-CM | POA: Diagnosis not present

## 2023-03-26 DIAGNOSIS — Z950 Presence of cardiac pacemaker: Secondary | ICD-10-CM | POA: Diagnosis not present

## 2023-03-26 DIAGNOSIS — I4891 Unspecified atrial fibrillation: Secondary | ICD-10-CM | POA: Diagnosis not present

## 2023-04-06 DIAGNOSIS — I480 Paroxysmal atrial fibrillation: Secondary | ICD-10-CM | POA: Diagnosis not present

## 2023-04-06 DIAGNOSIS — I2510 Atherosclerotic heart disease of native coronary artery without angina pectoris: Secondary | ICD-10-CM | POA: Diagnosis not present

## 2023-04-08 DIAGNOSIS — M722 Plantar fascial fibromatosis: Secondary | ICD-10-CM | POA: Diagnosis not present

## 2023-04-08 DIAGNOSIS — M79672 Pain in left foot: Secondary | ICD-10-CM | POA: Diagnosis not present

## 2023-05-27 DIAGNOSIS — I495 Sick sinus syndrome: Secondary | ICD-10-CM | POA: Diagnosis not present

## 2023-07-16 DIAGNOSIS — I480 Paroxysmal atrial fibrillation: Secondary | ICD-10-CM | POA: Diagnosis not present

## 2023-07-16 DIAGNOSIS — I119 Hypertensive heart disease without heart failure: Secondary | ICD-10-CM | POA: Diagnosis not present

## 2023-07-16 DIAGNOSIS — R400 Somnolence: Secondary | ICD-10-CM | POA: Diagnosis not present

## 2023-07-16 DIAGNOSIS — E785 Hyperlipidemia, unspecified: Secondary | ICD-10-CM | POA: Diagnosis not present

## 2023-07-16 DIAGNOSIS — R0683 Snoring: Secondary | ICD-10-CM | POA: Diagnosis not present

## 2023-07-16 DIAGNOSIS — I429 Cardiomyopathy, unspecified: Secondary | ICD-10-CM | POA: Diagnosis not present

## 2023-07-16 DIAGNOSIS — I495 Sick sinus syndrome: Secondary | ICD-10-CM | POA: Diagnosis not present

## 2023-07-16 DIAGNOSIS — I2510 Atherosclerotic heart disease of native coronary artery without angina pectoris: Secondary | ICD-10-CM | POA: Diagnosis not present

## 2023-08-26 DIAGNOSIS — I2510 Atherosclerotic heart disease of native coronary artery without angina pectoris: Secondary | ICD-10-CM | POA: Diagnosis not present

## 2023-09-06 DIAGNOSIS — H903 Sensorineural hearing loss, bilateral: Secondary | ICD-10-CM | POA: Diagnosis not present

## 2023-09-27 DIAGNOSIS — I480 Paroxysmal atrial fibrillation: Secondary | ICD-10-CM | POA: Diagnosis not present

## 2023-09-27 DIAGNOSIS — I5042 Chronic combined systolic (congestive) and diastolic (congestive) heart failure: Secondary | ICD-10-CM | POA: Diagnosis not present

## 2023-10-05 DIAGNOSIS — R5381 Other malaise: Secondary | ICD-10-CM | POA: Diagnosis not present

## 2023-10-05 DIAGNOSIS — I5022 Chronic systolic (congestive) heart failure: Secondary | ICD-10-CM | POA: Diagnosis not present

## 2023-10-05 DIAGNOSIS — I10 Essential (primary) hypertension: Secondary | ICD-10-CM | POA: Diagnosis not present

## 2023-10-05 DIAGNOSIS — N3942 Incontinence without sensory awareness: Secondary | ICD-10-CM | POA: Diagnosis not present

## 2023-10-05 DIAGNOSIS — H903 Sensorineural hearing loss, bilateral: Secondary | ICD-10-CM | POA: Diagnosis not present

## 2023-10-05 DIAGNOSIS — R0602 Shortness of breath: Secondary | ICD-10-CM | POA: Diagnosis not present

## 2023-10-05 DIAGNOSIS — K219 Gastro-esophageal reflux disease without esophagitis: Secondary | ICD-10-CM | POA: Diagnosis not present

## 2023-10-05 DIAGNOSIS — G4733 Obstructive sleep apnea (adult) (pediatric): Secondary | ICD-10-CM | POA: Diagnosis not present

## 2023-10-05 DIAGNOSIS — K449 Diaphragmatic hernia without obstruction or gangrene: Secondary | ICD-10-CM | POA: Diagnosis not present

## 2023-10-05 DIAGNOSIS — R5383 Other fatigue: Secondary | ICD-10-CM | POA: Diagnosis not present

## 2023-10-05 DIAGNOSIS — I480 Paroxysmal atrial fibrillation: Secondary | ICD-10-CM | POA: Diagnosis not present

## 2023-10-05 DIAGNOSIS — K5904 Chronic idiopathic constipation: Secondary | ICD-10-CM | POA: Diagnosis not present

## 2023-10-13 DIAGNOSIS — I4891 Unspecified atrial fibrillation: Secondary | ICD-10-CM | POA: Diagnosis not present

## 2023-10-13 DIAGNOSIS — Z950 Presence of cardiac pacemaker: Secondary | ICD-10-CM | POA: Diagnosis not present

## 2023-10-15 DIAGNOSIS — I495 Sick sinus syndrome: Secondary | ICD-10-CM | POA: Diagnosis not present

## 2023-10-20 DIAGNOSIS — I5022 Chronic systolic (congestive) heart failure: Secondary | ICD-10-CM | POA: Diagnosis not present

## 2023-10-20 DIAGNOSIS — Z0000 Encounter for general adult medical examination without abnormal findings: Secondary | ICD-10-CM | POA: Diagnosis not present

## 2023-10-20 DIAGNOSIS — G4733 Obstructive sleep apnea (adult) (pediatric): Secondary | ICD-10-CM | POA: Diagnosis not present

## 2023-10-20 DIAGNOSIS — R0602 Shortness of breath: Secondary | ICD-10-CM | POA: Diagnosis not present

## 2023-10-20 DIAGNOSIS — U099 Post covid-19 condition, unspecified: Secondary | ICD-10-CM | POA: Diagnosis not present

## 2023-10-20 DIAGNOSIS — F5104 Psychophysiologic insomnia: Secondary | ICD-10-CM | POA: Diagnosis not present

## 2023-10-21 DIAGNOSIS — R911 Solitary pulmonary nodule: Secondary | ICD-10-CM | POA: Diagnosis not present

## 2023-10-21 DIAGNOSIS — R9389 Abnormal findings on diagnostic imaging of other specified body structures: Secondary | ICD-10-CM | POA: Diagnosis not present

## 2023-11-29 DIAGNOSIS — I10 Essential (primary) hypertension: Secondary | ICD-10-CM | POA: Diagnosis not present

## 2023-11-29 DIAGNOSIS — E785 Hyperlipidemia, unspecified: Secondary | ICD-10-CM | POA: Diagnosis not present

## 2023-11-29 DIAGNOSIS — R7301 Impaired fasting glucose: Secondary | ICD-10-CM | POA: Diagnosis not present

## 2023-11-29 DIAGNOSIS — I2510 Atherosclerotic heart disease of native coronary artery without angina pectoris: Secondary | ICD-10-CM | POA: Diagnosis not present

## 2023-11-30 DIAGNOSIS — Z961 Presence of intraocular lens: Secondary | ICD-10-CM | POA: Diagnosis not present

## 2023-11-30 DIAGNOSIS — H5213 Myopia, bilateral: Secondary | ICD-10-CM | POA: Diagnosis not present

## 2023-11-30 DIAGNOSIS — H353133 Nonexudative age-related macular degeneration, bilateral, advanced atrophic without subfoveal involvement: Secondary | ICD-10-CM | POA: Diagnosis not present

## 2023-11-30 DIAGNOSIS — H52223 Regular astigmatism, bilateral: Secondary | ICD-10-CM | POA: Diagnosis not present

## 2023-11-30 DIAGNOSIS — H524 Presbyopia: Secondary | ICD-10-CM | POA: Diagnosis not present

## 2023-12-06 DIAGNOSIS — I480 Paroxysmal atrial fibrillation: Secondary | ICD-10-CM | POA: Diagnosis not present

## 2023-12-06 DIAGNOSIS — I2510 Atherosclerotic heart disease of native coronary artery without angina pectoris: Secondary | ICD-10-CM | POA: Diagnosis not present

## 2023-12-06 DIAGNOSIS — G4733 Obstructive sleep apnea (adult) (pediatric): Secondary | ICD-10-CM | POA: Diagnosis not present

## 2023-12-06 DIAGNOSIS — Z0000 Encounter for general adult medical examination without abnormal findings: Secondary | ICD-10-CM | POA: Diagnosis not present

## 2023-12-06 DIAGNOSIS — H9193 Unspecified hearing loss, bilateral: Secondary | ICD-10-CM | POA: Diagnosis not present

## 2023-12-06 DIAGNOSIS — Z7901 Long term (current) use of anticoagulants: Secondary | ICD-10-CM | POA: Diagnosis not present

## 2023-12-06 DIAGNOSIS — I10 Essential (primary) hypertension: Secondary | ICD-10-CM | POA: Diagnosis not present

## 2023-12-06 DIAGNOSIS — E785 Hyperlipidemia, unspecified: Secondary | ICD-10-CM | POA: Diagnosis not present

## 2024-03-23 DIAGNOSIS — I480 Paroxysmal atrial fibrillation: Secondary | ICD-10-CM | POA: Diagnosis not present

## 2024-03-23 DIAGNOSIS — E785 Hyperlipidemia, unspecified: Secondary | ICD-10-CM | POA: Diagnosis not present

## 2024-03-23 DIAGNOSIS — I119 Hypertensive heart disease without heart failure: Secondary | ICD-10-CM | POA: Diagnosis not present

## 2024-04-26 DIAGNOSIS — G4733 Obstructive sleep apnea (adult) (pediatric): Secondary | ICD-10-CM | POA: Diagnosis not present

## 2024-09-23 NOTE — Discharge Summary (Signed)
 Discharge Summary   Admitting Provider: Maude Debby Hides, MD Discharging Provider: Mark Jordan V, MD Primary Care Physician for Discharge: [name], MD [phone]   Admission Date: 09/20/2024     Discharge Date: 09/23/24 Length of Stay:  LOS: 3 days    Primary Discharge Diagnosis  Right lung lower lobe pulmonary embolus Acute CHF exacerbation EF 50 to 55% systolic dysfunction Hypertension Hypoxia Vitamin B12 deficiency GERD Nausea vomiting Weakness failure to thrive Chest pain    Secondary Diagnosis  coronary artery disease hypertension hyperlipidemia CHF EF 45 to 50% grade 1 diastolic dysfunction sinus node dysfunction status post pacemaker        Discharge Meds:    Your Medication List     STOP taking these medications    apixaban 5 mg Tab Commonly known as: ELIQUIS   cyanocobalamin  (vitamin B-12) 1,000 mcg/15 mL Liqd Replaced by: cyanocobalamin  (vitamin B-12) 1000 MCG tablet   fish oil-omega-3 fatty acids 300-1,000 mg capsule   losartan 25 MG tablet Commonly known as: COZAAR   magnesium 250 mg Tab   metoPROLOL succinate 25 MG 24 hr tablet Commonly known as: Toprol-XL   omeprazole 20 MG capsule Commonly known as: PriLOSEC Replaced by: pantoprazole 40 MG tablet   potassium gluconate 600 mg (99 mg) Tab   vitamin C 100 MG tablet Generic drug: ascorbic acid (vitamin C)       START taking these medications    acetaminophen 325 MG tablet Commonly known as: TYLENOL Take 2 tablets (650 mg total) by mouth every four (4) hours as needed.   albuterol 90 mcg/actuation inhaler Commonly known as: PROVENTIL HFA;VENTOLIN HFA Inhale 2 puffs every four (4) hours as needed for wheezing.   aluminum-magnesium hydroxide-simethicone 400-400-40 mg/5 mL suspension Commonly known as: MAALOX MAX Take 30 mL by mouth every six (6) hours as needed.   cyanocobalamin  (vitamin B-12) 1000 MCG tablet Take 1 tablet (1,000 mcg total) by mouth  daily. Start taking on: September 24, 2024 Replaces: cyanocobalamin  (vitamin B-12) 1,000 mcg/15 mL Liqd   enoxaparin 80 mg/0.8 mL injection Commonly known as: LOVENOX Inject 0.8 mL (80 mg total) under the skin every twelve (12) hours for 8 days.   hydrOXYzine 50 MG tablet Commonly known as: ATARAX Take 1 tablet (50 mg total) by mouth every six (6) hours as needed for itching.   levalbuterol 0.63 mg/3 mL nebulizer solution Commonly known as: XOPENEX Inhale 3 mL (0.63 mg total) by nebulization every four (4) hours as needed (Shortness of breath).   midodrine 5 MG tablet Commonly known as: PROAMATINE Take 1 tablet (5 mg total) by mouth every twelve (12) hours.   pantoprazole 40 MG tablet Commonly known as: Protonix Take 1 tablet (40 mg total) by mouth Two (2) times a day (30 minutes before a meal). Replaces: omeprazole 20 MG capsule   polyethylene glycol 17 gram packet Commonly known as: MIRALAX Take 17 g by mouth daily. Start taking on: September 24, 2024   warfarin 5 MG tablet Commonly known as: JANTOVEN Take 1 tablet (5 mg total) by mouth daily with evening meal.       CHANGE how you take these medications    furosemide 20 MG tablet Commonly known as: LASIX Take 1 tablet (20 mg total) by mouth two (2) times a day for 13 days. What changed:  how much to take how to take this when to take this additional instructions   ICAPS AREDS 4,296 mcg-226 mg-90 mg Cap Generic drug: vitamins A,C,E-zinc-copper Take  1 tablet by mouth in the morning. What changed:  medication strength how much to take when to take this       CONTINUE taking these medications    cholecalciferol (vitamin D3 25 mcg (1,000 units)) 1,000 unit (25 mcg) tablet Take 1 tablet (25 mcg total) by mouth in the morning.   flecainide 100 MG tablet Commonly known as: TAMBOCOR Take 0.5 tablets (50 mg total) by mouth two (2) times a day.          Dischareg Disposition  (  x) Home.  (  ) SNF: (   )[facility]  (  )[facility]  (  ) [facility] (  )[facility] (  )OTHER:      Recommended Discharge Follow up: Follow up appointments scheduled. Future Appointments  Date Time Provider Department Center  10/11/2024  2:40 PM Doorey, Prentice Pac, MD [facility]  05/11/2025  9:40 AM Royal, Garnette Barters, MD [facility]   She will follow-up with the PCP in 1 week CBC BMP magnesium phosphorus and INR levels with PCP visit and weekly INR Coumadin clinic in 1 week Follow-up with Dr. Ovidio of cardiology [place] 2 to 3 weeks Follow-up with Dr. Peri hematology [facility] 2 to 3 weeks Social work consulted for home O2 2 L nasal cannula on discharge and home health services and walker  The Patient is advised to contact and/or follow up with primary care doctor within 1-2 weeks.   Code Status at dc: DNR and DNI   Consults:   Conferred telephonically with hematology Dr. Darina      Procedures:    Presenting Problem: Dyspnea on exertion [R06.09] Hypoxia [R09.02] Acute congestive heart failure, unspecified heart failure type (CMS-HCC) [I50.9] Pulmonary embolism, unspecified chronicity, unspecified pulmonary embolism type, unspecified whether acute cor pulmonale present    (CMS-HCC) [I26.99]  History of Present Illness/Hospital Course Per HPI: Patient was admitted on 09/20/2024 being a 88 year old female with a past medical history of CAD, HTN, HLD, nonischemic cardiomyopathy (LVEF 45-50%, grade 1 diastolic dysfunction), and sinus node dysfunction s/p [employer] pacemaker (2012), who presents with progressive dyspnea on exertion. The patient is hard of hearing; history obtained with assistance from her grandson who is at bedside.   According to the patient, symptoms began approximately 3 weeks ago. Her grandson reports that the patient lives alone but is regularly checked on by family members. She reports shortness of breath when ambulating from  her bedroom to the kitchen. She was seen at an urgent care clinic 2 days ago and received IM Lasix with temporary symptomatic improvement. Today, she was advised to present to the ED for further evaluation due to concern for fluid overload per elevated BNP.   The patient reports minimal lower extremity edema. She denies chest pain, abdominal pain, upper respiratory symptoms, fever, or recent illness. She reports chronic constipation, last bowel movement this morning, and uses Miralax.   She has been on Xarelto  for 10 years, recently transitioned to Eliquis approximately 3 months ago by Dr. Ovidio; denies history of DVT or PE. She denies history of cancer. Her last colonoscopy was before age 21; she recalls three polyps were removed, and was counseled regarding cancer risk. She denies weight loss or changes in appetite.     ED Course  VSS with BP 116/83, P 65, RR 20, Temp 98.1 and sats 97% however desats to 87% with ambulation    Work up revealed flat troponin, improved BNP 333 from  437 (09/18/24), AKI with Cr 1.20 baseline 0.83, mild transaminitis AST/ALT 58/63.Mild anemia with Hb 10.8    ECG showed Wide QRS rhythm With Paced Rhythm. Prolong QTC 567    CXR unrevealing however, CTA chest revealed PE in Rt Lower lobe of main branch vessel. Normal RV LV ratio. No right heart failure signs. Normal aortic arch and descending aorta.    She was given IV Lasix 20 mg x 1 and therapeutic heparin was initiated in the ED. Patient was subsequently admitted for mgmt of PE    ER course <redacted file path> was notable for PE on CTA Chest.  The read did show pulmonary embolus in the right lower lobe main branch vessel.  Patient did have chest x-ray was normal.  Echocardiogram was done which did show EF of 50 to 55% mild to moderate aortic regurgitation mild to moderate mitral valve regurgitation and pulmonary artery systolic pressure was 42 mmHg.  Patient initially placed on IV heparin infusion.  Did confer with  hematology Dr. Darina telephonically and as the patient was compliant on Eliquis likely had treatment failure as patient did develop this chest pain shortness of breath relatively acute onset despite being on Eliquis and was compliant.  Recommended not using Xarelto  and placed the patient transition from IV heparin infusion after 48 hours to subcu Lovenox and bridging onto Coumadin therapy.  Patient did have some mild hypoxia and was needing O2 2 L.  Chest x-ray did suggest some interstitial prominence and CHF.  Patient was placed on IV Lasix 20 mg twice daily.  She did have low blood pressure between 95 and 105 mmHg and the metoprolol and losartan was held was placed on midodrine 5 mg p.o. 3 times daily and did get IV Lasix 10 mg twice daily and had good diuresis with improvement in her pulm edema and shortness of breath.  She was still needing O2 2 L.  She was transition from IV heparin to subcu Lovenox bridging onto Coumadin therapy.  80 mg twice daily Lovenox dosage and continue until an INR is more than 2.  Patient would follow-up with her PCP cardiology and hematology following discharge.  Social work was consulted for home health services walker and home O2 on discharge.  Patient had no further chest pain shortness of breath and was transition from IV Lasix to p.o. Lasix was to continue 20 mg p.o. twice daily for 3 days and then continue 20 mg p.o. daily for total of 10 days following that dosage.  Patient had stable hemoglobin at 9.8.  Patient still had mild hypotension but did improve to systolic of 150 mmHg and midodrine was to transition to 5 mg p.o. twice daily.  The metoprolol and losartan doses were held on discharge but was continued on flecainide at 50 mg p.o. twice daily with a history of A-fib.  Patient was in stable condition discharge then on 09/23/2024 to follow-up with PCP cardiology Coumadin clinic and hematology following discharge.  Social work to get home O2 on discharge 2 L nasal cannula  Home walker and home health services on discharge    Physical Exam at Discharge Temp:  [36.6 C (97.9 F)-37 C (98.6 F)] 36.7 C (98.1 F) Pulse:  [65-66] 66 Resp:  [16-18] 18 BP: (100-117)/(63-77) 115/74 MAP (mmHg):  [78-90] 88 SpO2:  [94 %-96 %] 96 % Vitals: As noted above  General: Awake, alert, oriented x 4, no acute distress noted ENT-normal CV: Regular rate rhythm, S1-S2 audible no edema Respiratory: Clear  to auscultation bilaterally no rales or wheezing Abdomen: Soft, non tender, normoactive bowel sounds Extremities: No pedal edema       XR Chest Portable Result Date: 09/23/2024 HISTORY:CHF Exam: XR CHEST 1 VIEW. COMPARISON: 09/22/2024 FINDINGS:Pacemaker leads appear to be appropriately position. The cardiomediastinal silhouette is enlarged. The pulmonary vascular is normal in appearance and distribution.   Increased interstitial opacity within the left lung base consistent with atelectasis or scarring. Right lung remains clear. No pleural effusion seen.   No acute abnormality of the visualized bones identified.   Left basilar atelectasis or scarring. Electronically signed by:  Lamar Slight MD  09/23/2024 09:36 AM EDT RP Workstation: [id number]  Echocardiogram W Colorflow Spectral Doppler Result Date: 09/22/2024 Patient Info Name:     Jamie Morales Age:     59 years DOB:     February 15, 1936 Gender:     Female MRN:     [id number] Accession #:     [id number] Account #:     [id number] Ht:     152 cm Wt:     83 kg BSA:     1.92 m2 BP:     117 /     78 mmHg HR:     65 bpm Exam Date:     09/22/2024 11:47 AM Admit Date:     09/20/2024   Exam Type:     ECHOCARDIOGRAM W COLORFLOW SPECTRAL DOPPLER   Technical Quality:     Fair   Staff Sonographer:     Audie Dawn Referring Physician:     None Per Patient Pcp Ordering Physician:     Mark Jordan V   Study Info Indications      - chf Procedure(s)   Complete two-dimensional, color flow and Doppler transthoracic echocardiogram is  performed.     Summary   1. The left ventricle is normal in size with mildly increased wall thickness.   2. The left ventricular systolic function is borderline, LVEF is visually estimated at 50-55%.   3. The mitral valve leaflets are mildly thickened with normal leaflet mobility.   4. There is mild to moderate mitral valve regurgitation.   5. There is mild to moderate aortic regurgitation.   6. The left atrium is mildly dilated in size.   7. The right ventricle is normal in size, with normal systolic function.   8. TR maximum velocity: 2.8 m/s  Estimated PASP: 42 mmHg.   9. There is moderate tricuspid regurgitation.   10. The aortic valve is trileaflet with moderately thickened leaflets with normal excursion.     Left Ventricle   The left ventricle is normal in size with mildly increased wall thickness. The left ventricular systolic function is borderline, LVEF is visually estimated at 50-55%. Left ventricular diastolic function cannot be accurately assessed.   Right Ventricle   The right ventricle is normal in size, with normal systolic function. Additional right ventricle findings: there is a device lead present.     Left Atrium   The left atrium is mildly dilated in size.   Right Atrium   The right atrium is normal in size. There is a device lead present.     Aortic Valve   The aortic valve is trileaflet with moderately thickened leaflets with normal excursion. There is mild to moderate aortic regurgitation. There is no evidence of a significant transvalvular gradient. AV Doppler velocity ratio (dimensionless index):  0.66.   Mitral Valve   The mitral valve leaflets are mildly thickened with  normal leaflet mobility. There is mild to moderate mitral valve regurgitation.   Tricuspid Valve   The tricuspid valve leaflets are normal, with normal leaflet mobility. There is moderate tricuspid regurgitation. TR maximum velocity: 2.8 m/s  Estimated PASP: 42 mmHg.   Pulmonic Valve   The pulmonic  valve is poorly visualized, but probably normal.     Aorta   The aorta is normal in size in the visualized segments.   Inferior Vena Cava   IVC size and inspiratory change suggest normal right atrial pressure. (0-5 mmHg).   Pericardium/Pleural   There is no pericardial effusion.     Ventricles ---------------------------------------------------------------------- Name                                 Value        Normal ----------------------------------------------------------------------   LV Dimensions 2D/MM ----------------------------------------------------------------------  IVS Diastolic Thickness (2D)                                1.1 cm       0.6-0.9 LVID Diastole (2D)                  4.0 cm       3.8-5.2  LVPW Diastolic Thickness (2D)                                1.1 cm       0.6-0.9 LVID Systole (2D)                   2.7 cm       2.2-3.5 LVOT Diameter                       1.9 cm               LV Mass Index (2D Cubed)           76 g/m2         43-95  Relative Wall Thickness (2D)                                  0.55        <=0.42   LV Function ---------------------------------------------------------------------- LV EF (4C MOD)                        51 %                 RV Dimensions 2D/MM ---------------------------------------------------------------------- TAPSE                               1.9 cm         >=1.7   Atria ---------------------------------------------------------------------- Name                                 Value        Normal ----------------------------------------------------------------------   LA Dimensions ---------------------------------------------------------------------- LA Dimension (2D)                   3.5 cm       2.7-3.8 LA  Volume Index (4C A-L)        25.14 ml/m2                 RA Dimensions ---------------------------------------------------------------------- RA Area (4C)                      18.0 cm2        <=18.0 RA Area (4C) Index               9.4 cm2/m2               RA ESV Index (4C MOD)             26 ml/m2         15-27   Left Ventricular Outflow Tract ---------------------------------------------------------------------- Name                                 Value        Normal ----------------------------------------------------------------------   LVOT 2D ---------------------------------------------------------------------- LVOT Diameter                       1.9 cm               LVOT Area                          2.8 cm2                 LVOT Doppler ---------------------------------------------------------------------- LVOT Peak Velocity                 0.8 m/s   Aortic Valve ---------------------------------------------------------------------- Name                                 Value        Normal ----------------------------------------------------------------------   AV Doppler ---------------------------------------------------------------------- AV Peak Velocity                   1.2 m/s               AV Peak Gradient                    5 mmHg               AV Mean Gradient                    3 mmHg               AV VTI                               23 cm               AV Area (Cont Eq Vel)              1.9 cm2               AV Area Index (Cont Eq Vel)     1.0 cm2/m2               AV DI (Vel)                           0.66   Mitral Valve ----------------------------------------------------------------------  Name                                 Value        Normal ----------------------------------------------------------------------   MV Diastolic Function ---------------------------------------------------------------------- MV E Peak Velocity                103 cm/s               MV A Peak Velocity                 56 cm/s               MV E/A                                 1.8                 MV Annular TDI ---------------------------------------------------------------------- MV Septal e' Velocity              7.7 cm/s         >=8.0 MV E/e' (Septal)                      13.3               MV Lateral e' Velocity            8.2 cm/s        >=10.0 MV E/e' (Lateral)                     12.6               MV e' Average                     7.9 cm/s               MV E/e' (Average)                     13.0   Tricuspid Valve ---------------------------------------------------------------------- Name                                 Value        Normal ----------------------------------------------------------------------   TV Regurgitation Doppler ---------------------------------------------------------------------- TR Peak Velocity                   2.8 m/s                 Estimated PAP/RSVP ---------------------------------------------------------------------- RA Pressure                        10 mmHg           <=5 RV Systolic Pressure               42 mmHg           <36   Venous ---------------------------------------------------------------------- Name                                 Value        Normal ----------------------------------------------------------------------   IVC/SVC ---------------------------------------------------------------------- IVC Diameter (Insp 2D)  1.8 cm               IVC Diameter (Exp 2D)               2.1 cm         <=2.1  IVC Diameter Percent Change (2D)                                  14 %          >=50     Report Signatures Finalized by Manford Ned  MD on 09/22/2024 03:51 PM  XR Chest Portable Result Date: 09/22/2024 HISTORY:CHF Exam: XR CHEST 1 VIEW. COMPARISON: 09/20/2024 FINDINGS:Pacemaker leads appear to be appropriately position. Cardiac silhouette is upper limits normal for size. Prominence of the central pulmonary vasculature is noted. No infiltrate or consolidation identified. No pleural effusion seen.   No acute abnormality of the visualized bones identified.   Prominence of the central pulmonary vasculature which may be secondary to pulmonary  vascular congestion. Electronically signed by:  Lamar Slight MD  09/22/2024 08:28 AM EDT RP Workstation: [id number]  PVL Venous Duplex Lower Extremity Bilateral Result Date: 09/20/2024 EXAMINATION: US  LOWER EXTREMITY VEINS BILATERAL COMPARISON: None available. INDICATION: 88 year old female with acute pulmonary embolus on CT. Concern for DVT FINDINGS: Duplex Doppler ultrasound evaluation of the bilateral lower extremity deep veins from the groin to the upper calf  is performed using gray scale and color Doppler imaging. Grayscale evaluation demonstrates all the veins are fully compressible. No acute or chronic deep vein thrombus formation is evident. Doppler analysis demonstrates that venous flow is normal, spontaneous, phasic and augmentable.  No evidence of bilateral lower extremity deep vein thrombosis. Electronically signed by:  Toribio Presser MD  09/20/2024 10:18 PM EDT RP Workstation: [id number]  CTA Chest W Contrast Result Date: 09/20/2024  ADDENDUM #1 Addendum: I called this report to Dr. Marry Shaggy at 6:58 PM central time Electronically signed by:  Elsie Gustin MD  09/20/2024 09:46 PM EDT RP Workstation: [id number]  ORIGINAL REPORT EXAMINATION: CT CHEST ANGIOGRAPHY WITH IV CONTRAST CLINICAL INDICATION: Female, 88 years old. Dyspnea on exertion with sat of 87% wide mediastinum on chest x-ray. TECHNIQUE: This examination was performed according to an angiographic protocol with 3D post-processing. This involves 3D reconstructions, MIPs, volume rendered images and/or shaded surface rendering. One or more of the following dose reduction techniques were used: Automated exposure control, adjustment of the mA and/or kV according to patient size, and/or iterative reconstruction. Unless otherwise specified, incidental findings do not require dedicated imaging follow-up. CONTRAST: 60 mL of Isovue 300. COMPARISON: XR Chest 09/20/2024, 3:58 PM. FINDINGS: LOWER NECK: Visualized thyroid  gland and  soft tissues are normal. LUNGS AND AIRWAYS: Airways are clear. No evidence of airspace or interstitial process. No nodules. PLEURA: No pleural effusion. No pneumothorax. Hemidiaphragms are normally positioned. MEDIASTINUM AND LYMPH NODES: No mediastinal mass or fluid collection. Normal size mediastinal, hilar, and axillary lymph nodes. THORACIC AORTA: Normal caliber and configuration. No evidence of intramural hematoma or dissection. PULMONARY ARTERIES: Normal caliber. There is a single embolus identified in the main right lower lobe pulmonary artery, best seen on CT series #5 image 152. Small clot volume. No right heart failure or abnormal RV LV ratio. HEART: Normal heart size. No pericardial effusion. No coronary calcifications. OSSEOUS STRUCTURES AND CHEST WALL: Intact. UPPER ABDOMEN: No acute or significant abnormalities.  1. Study is positive for pulmonary embolus in the  right lower lobe main branch vessel. Normal RV LV ratio. No right heart failure signs. 2. Normal aortic arch and descending aorta. 3. Clear lungs. Electronically signed by:  Elsie Gustin MD  09/20/2024 07:53 PM EDT RP Workstation: [id number]  XR Chest Portable Result Date: 09/20/2024 HISTORY:SHORTNESS OF BREATH Exam: XR CHEST 1 VIEW. COMPARISON: 01/28/2018 FINDINGS:Pacemaker leads appear to be appropriately position. The cardiomediastinal silhouette is within normal limits for size .   The pulmonary vascular is normal in appearance and distribution.   No infiltrate or consolidation identified. No pleural effusion seen.   No acute abnormality of the visualized bones identified.   No acute cardiopulmonary process. Electronically signed by:  Lamar Slight MD  09/20/2024 04:14 PM EDT RP Workstation: [id number]  ECG 12 Lead Result Date: 09/20/2024 Wide QRS rhythm Left axis deviation Nonspecific intraventricular block Cannot rule out Anterior infarct , age undetermined Abnormal ECG When compared with ECG of 05-Feb-2020 14:01, Wide QRS  rhythm has replaced Electronic atrial pacemaker  XR Abdomen 1 View Result Date: 09/18/2024 EXAM DESCRIPTION: XR ABDOMEN 1 VIEW; 09/18/2024 12:58:06 PM REASON FOR STUDY: R14.0-Abdominal distention; ABDOMINAL PAIN - OTHER SPECIFIED SITE COMPARISON: None. NUMBER OF VIEWS: One view. TECHNIQUE:  Supine radiographic image of the abdomen acquired.  FINDINGS: Lung bases not included in the field of view. There is a nonspecific osteophyte of bowel gas pattern present. Mild fecal retention is noted. Degenerative changes of the bilateral hips noted. Spondylosis noted in the visualized spine. SI joints appear patent.  NO RADIOGRAPHIC EVIDENCE OF INTESTINAL OBSTRUCTION COMMENT: TECHNICAL DOCUMENTATION: Copyright 2011 [employer]- All Rights Reserved Electronically signed by:  Reyes Huddle MD  09/18/2024 01:07 PM EDT RP Workstation: [id number]     All lab results last 24 hours:   Recent Results (from the past 24 hours)  Phosphorus Level   Collection Time: 09/23/24  2:51 AM  Result Value Ref Range   Phosphorus 4.7 (H) 2.5 - 4.6 mg/dL  Magnesium Level   Collection Time: 09/23/24  2:51 AM  Result Value Ref Range   Magnesium 2.0 1.7 - 2.5 mg/dL  Basic Metabolic Panel   Collection Time: 09/23/24  2:51 AM  Result Value Ref Range   Sodium 137 135 - 153 mmol/L   Potassium 4.5 3.5 - 5.3 mmol/L   Chloride 104 98 - 110 mmol/L   CO2 24.0 24.0 - 31.0 mmol/L   Anion Gap 9 5 - 16 mmol/L   BUN 26 (H) 8 - 21 mg/dL   Creatinine 8.89 9.49 - 1.40 mg/dL   BUN/Creatinine Ratio 24 (H) 6 - 20   eGFR CKD-EPI (2021) Female 49 (L) >=60 mL/min/1.10m2   Glucose 91 75 - 110 mg/dL   Calcium 8.4 (L) 8.5 - 10.4 mg/dL  PT-INR   Collection Time: 09/23/24  2:51 AM  Result Value Ref Range   PT 11.8 (H) 9.3 - 11.5 sec   INR 1.06 0.90 - 1.10  CBC w/ Differential   Collection Time: 09/23/24  2:51 AM  Result Value Ref Range   WBC 6.1 4.8 - 10.8 10*9/L   RBC 3.22 (L) 4.20 - 5.40 10*12/L   HGB 9.8 (L) 12.0 -  16.0 g/dL   HCT 68.7 (L) 62.9 - 52.9 %   MCV 96.9 81.0 - 102.0 fL   MCH 30.4 26.0 - 34.0 pg   MCHC 31.4 31.0 - 35.5 g/dL   RDW 85.1 88.4 - 84.4 %   MPV 10.5 (H) 7.0 - 10.0 fL   Platelet 242 130 - 400  10*9/L   Neutrophils % 54.0 %   Immature Granulocytes Relative Percent 0.2 %   Lymphocytes % 34.9 %   Monocytes % 8.9 %   Eosinophils % 1.2 %   Basophils % 0.8 %   Absolute Neutrophils 3.3 1.4 - 6.5 10*9/L   Immature Granulocytes Absolute Count 0.0 0.0 - 0.6 10*9/L   Absolute Lymphocytes 2.1 1.2 - 3.4 10*9/L   Absolute Monocytes 0.5 0.1 - 0.5 10*9/L   Absolute Eosinophils 0.1 0.0 - 0.7 10*9/L   Absolute Basophils 0.1 0.0 - 0.4 10*9/L     Discharge Instructions:  Discussed treatment plan and discharge with patient/family. Return precautions provided.     Total time spent coordinating care, family/patient education, chart review and face to face time is >35 minutes Signed: Mark V Jordan, MD Date: 09/23/2024

## 2024-10-11 NOTE — Progress Notes (Signed)
 " [facility]  OUT PATIENT CARDIOLOGY VISIT   Referring Physician: [name], Md [address] [facility] [place],  [place] [address]  10/11/2024  Patient's DOB: 1936-05-16  Referring Provider: [name]*  Primary Provider: [name], MD     Assessment & Plan: 1.  Mild coronary artery disease:  Cardiac catheterization 2007 showed luminal irregularities in the LAD artery and RCA, but no obstructive stenosis She denies having had any recent chest pain or anginal equivalent  She has been told to seek medical attention for return of chest pain   2. Sick sinus syndrome:  In 12/2010 Medtr[employer] PPM was placed for this  Longevity of her patient battery is estimated at 7 months  3. Atrial Fibrillation, Paroxysmal:  Patient remains on metoprolol for rate control and flecainide for rhythm control; she denies any recent falls or unexpected bleeding CHA2DS2-VASc score is 6 (CHF-1, HTN-1, age > 75-2, CAD-1, female-1) Patient consulted with [facility] electrophysiology Dr. Leni on 09/27/23 and his report was reviewed; he told her that she wasn't a candidate for CRT upgrade due to ventricular pacing of only 1%  4. Hypertensive heart disease: BP today is 145/100 mmHg She will be restarted on metoprolol succinate 25 mg daily The possible side effects of this medication were discussed with her.  03/23/24, potassium 4.4 and creatinine 0.83 Low sodium <1500 mg daily advised Patient agrees to follow up her blood pressure and electrolytes   5. Hyperlipidemia:  Review of 10/07/22 lipid profile shows LDL 128 Review of 10/06/23 liver function studies shows AST and ALT normal Patient reports history of myalgias from statin agents, and her hyperlipidemia is not treated at this time A low cholesterol, low saturated fat diet was recommended   6. Cardiomyopathy/Congestive heart failure:  Review of 02/11/21 echocardiogram showed EF of  35-40% Review of 02/24/2022 echocardiogram showed mildly increased left ventricular wall thickness, mild global hypokinesis with estimated LVEF of 40%, mild to moderate mitral valve regurgitation, mild aortic valve regurgitation and mild tricuspid valve regurgitation Patient shows no signs or symptoms of uncompensated heart failure at this time, review of her BNP performed on 10/05/2023 was normal at 58.4, indicating no current heart failure Her weakness was also evaluated with CBC on 10/05/2023, review of this showed hemoglobin to be only slightly decreased at 11.6, and review of her TSH revealed this to be normal at 3.15 Patient advised to limit sodium intake to <1500 mg daily She had an admission to [facility] from 09/20/24 til 09/23/24 acute congestive heart failure exacerbation and treated.  7. Pulmonary Embolus: She had an admission to [facility] from 09/20/24 til 09/23/24 for right lung pulmonary embolus She was started on Coumadin She has not gone to the [facility] and she was advised to go there today and as often as they recommend!! Her echocardiogram done 09/20/24 showed LVEF 50-55%   Reason for visit: The patient is seen for cardiology follow up. Patient seen in the presence of Alix Flatness, RN.   History of Present Illness: Patient had a pulmonary embolus on 09/20/24 For history of present illness of cardiac problems being followed, please see assessment and plan  Past Cardiac History:  Cath / PCI: NA  CV Surgery: NA  EP Procedures and Devices: NA  Non-Invasive Evaluation(s): NA  Allergies: Allergies  Allergen Reactions   Codeine Nausea Only and Nausea And Vomiting    Nausea, headache    Alendronate Sodium    Benzonatate    Ezetimibe-Simvastatin Other (  See Comments)   Lisinopril Cough    Past Medical/Surgical History: Past Medical History:  Diagnosis Date   Essential (primary) hypertension    [SE: Hypertension]   Unspecified atrial fibrillation    (CMS-HCC)     [SE: A-fib (CMS/HCC)]   Past Surgical History:  Procedure Laterality Date   BREAST LUMPECTOMY Left 1970   Benign [SE: BREAST LUMPECTOMY]   CARDIAC PACEMAKER PLACEMENT     [SE: CARDIAC PACEMAKER PLACEMENT]   COLONOSCOPY     [SE: COLONOSCOPY]   ESOPHAGOGASTRODUODENOSCOPY  2019   Hiatal hernia found - Dr. Celestia. [SE: ESOPHAGOGASTRODUODENOSCOPY]    Medications: Current Outpatient Medications  Medication Sig Dispense Refill   acetaminophen (TYLENOL) 325 MG tablet Take 2 tablets (650 mg total) by mouth every four (4) hours as needed. 60 tablet 0   albuterol HFA 90 mcg/actuation inhaler Inhale 2 puffs every four (4) hours as needed for wheezing. 100 g 0   aluminum-magnesium hydroxide-simethicone (MAALOX MAX) 400-400-40 mg/5 mL suspension Take 30 mL by mouth every six (6) hours as needed. 355 mL 0   cholecalciferol, vitamin D3 25 mcg, 1,000 units,, 1,000 unit (25 mcg) tablet Take 1 tablet (25 mcg total) by mouth in the morning. 30 tablet 0   cyanocobalamin  1000 MCG tablet Take 1 tablet (1,000 mcg total) by mouth daily. 30 tablet 2   flecainide (TAMBOCOR) 100 MG tablet Take 0.5 tablets (50 mg total) by mouth two (2) times a day. 90 tablet 2   furosemide (LASIX) 20 MG tablet Take 1 tablet (20 mg total) by mouth two (2) times a day for 13 days. 26 tablet 0   hydrOXYzine (ATARAX) 50 MG tablet Take 1 tablet (50 mg total) by mouth every six (6) hours as needed for itching. 30 tablet 0   levalbuterol (XOPENEX) 0.63 mg/3 mL nebulizer solution Inhale 3 mL (0.63 mg total) by nebulization every four (4) hours as needed (Shortness of breath). 3 mL 12   midodrine (PROAMATINE) 5 MG tablet Take 1 tablet (5 mg total) by mouth every twelve (12) hours. 60 tablet 1   pantoprazole (PROTONIX) 40 MG tablet Take 1 tablet (40 mg total) by mouth Two (2) times a day (30 minutes before a meal). 180 tablet 0   polyethylene glycol (MIRALAX) 17 gram packet Take 17 g by mouth daily. 30 packet 2    vitamins A,C,E-zinc-copper (ICAPS AREDS) 4,296 mcg-226 mg-90 mg cap Take 1 tablet by mouth in the morning. 30 capsule 0   warfarin (JANTOVEN) 5 MG tablet Take 1 tablet (5 mg total) by mouth daily with evening meal. 20 tablet 1   No current facility-administered medications for this visit.    Family History: Family History  Problem Relation Age of Onset   Heart attack Mother        SE: Mother - Heart attack   Heart attack Father        SE: Father - Heart attack   Heart disease Sister     Social History: Social History   Socioeconomic History   Marital status: Widowed  Tobacco Use   Smoking status: Never    Passive exposure: Never   Smokeless tobacco: Never  Vaping Use   Vaping status: Never Used  Substance and Sexual Activity   Alcohol  use: No   Drug use: No   Social Drivers of Corporate Investment Banker Strain: Low Risk  (09/21/2024)   Overall Financial Resource Strain (CARDIA)    Difficulty of Paying Living Expenses: Not hard at all  Food Insecurity: No Food Insecurity (09/21/2024)   Hunger Vital Sign    Worried About Running Out of Food in the Last Year: Never true    Ran Out of Food in the Last Year: Never true  Transportation Needs: No Transportation Needs (09/21/2024)   PRAPARE - Administrator, Civil Service (Medical): No    Lack of Transportation (Non-Medical): No  Housing: Low Risk  (09/21/2024)   Housing    Within the past 12 months, have you ever stayed: outside, in a car, in a tent, in an overnight shelter, or temporarily in someone else's home (i.e. couch-surfing)?: No    Are you worried about losing your housing?: No    Review of Systems: A 12-system review was performed and all others were negative except for what is marked above and what is noted in the HPI.  VITAL SIGNS: There were no vitals taken for this visit.  Physical Exam: GENERAL: Obese. No apparent distress.  EYES: Non-injected conjunctiva. No xanthelasma.   ENT:  Teeth are all crowns. HOH.  NECK: Supple, JVD not present, lymphadenopathy not present.  CARDIOVASCULAR: Regular rate and rhythm, normal S1 and S2. +S4. Point of maximal impulse is normal. No murmurs, no lifts, no rubs. 2+ bilateral carotid pulses, 2+ bilateral dorsalis pedis pulses, 1+ bilateral posterior tibial pulses.  EDEMA: 1+ pedal edema.  RESPIRATORY: Clear to auscultation bilaterally. Normal respiratory effort without the use of accessory muscles.  GASTROINTESTINAL: The abdomen is soft, non-tender with no hepatosplenomegaly. Abdominal aorta is not palpable.  MUSCULOSKELETAL: Kyphosis is not present. Ambulatory ability satisfactory.  EXTREMITIES: No clubbing or cyanosis.  SKIN: No obvious rash, no petechiae. Scar left chest. NEUROLOGIC: Alert and oriented x3 with no focal deficits. Mood and affect are normal.   Entered by Alix Flatness, RN, acting as scribe for Dr. Lora. 10/11/2024  Documentation assistance was provided by the scribe. I was present during the time the encounter was recorded. The information recorded by the scribe was done at my direction and has been reviewed and validated by me. The documentation recorded by the scribe accurately reflects the service I personally performed and the decisions made by me.   Dictated using [employer].  Though reviewed it may contain errors in syntax, grammar, or spelling "

## 2024-10-12 NOTE — Progress Notes (Signed)
 [facility]  Hematology Consultation  Date of Consult:  10/12/2024  Jamie Morales  Referring Physician:  Jordan, Mark V, Md [address] [place],  [place] [address]. [phone]  PCP: [name], MD  Visit Diagnoses 1. Acute pulmonary embolism without acute cor pulmonale, unspecified pulmonary embolism type    (CMS-HCC)      SUBJECTIVE:  Reason for Consultation: Pulmonary embolism  History of Present Illness: Jamie Morales is a 88 y.o. female with coronary artery disease, hypertension, hyperlipidemia, CHF EF 45 to 50% grade 1 diastolic dysfunction, sinus node dysfunction status post pacemaker who presents for evaluation of pulmonary embolism.   She was recently admitted from 10/1-10/4 after she presented with a several week history of shortness of breath and dyspnea on exertion secondary to fluid overload. Per patient, she has had these issues on and off for the past 1 year, and have been getting progressively worse over the past few weeks/ months. CTA chest revealed PE in Rt lower lobe of main branch vessel. Normal RV LV ratio. No right heart failure signs. Normal aortic arch and descending aorta. Since the patient may have had a breakthrough clot while on eliquis, after consultation with Hematology, she was bridged to warfarin.   She has been on Xarelto  for 10 years, recently transitioned to Eliquis approximately 6 months ago by Dr. Ovidio; denies previous history of DVT or PE. She denies history of cancer. Her last colonoscopy was before age 54; she recalls three polyps were removed. Last mammogram was at least 10 years ago which was normal. She denies weight loss or changes in appetite. A duplex on 10/1 showed no DVT in LE. Anticardiolipin antibodies evaluated for and negative.  She is currently taking warfarin 5 mg daily and denies active bleeding or bruising.   She also reports B12 deficiency for which she takes daily b12 supplements since last year. Most recent  levels are adequate.   She otherwise feels well today and has no acute complaints apart from chronic fatigue. She is a non smoker and denies alcohol  use. Reports history of breast cancer in her sister.   Review of Systems  Constitutional: Negative.  Negative for fatigue and fever.  Respiratory:  Negative for chest tightness, cough, hemoptysis and shortness of breath.   Cardiovascular:  Negative for chest pain, leg swelling and palpitations.  Gastrointestinal:  Negative for abdominal pain, diarrhea, nausea and vomiting.  Musculoskeletal:  Negative for arthralgias, back pain, flank pain, myalgias and neck pain.  Skin:  Negative for rash.  Neurological:  Negative for dizziness, headaches, light-headedness and numbness.  All other systems reviewed and are negative.    Past Medical History[1] Past Surgical History[2] Family History[3]  Hematology/Oncology History   No problem history exists.    Social History:  Social History   Tobacco Use   Smoking status: Never    Passive exposure: Never   Smokeless tobacco: Never  Substance Use Topics   Alcohol  use: No     Thorough tobacco history discussed with patient, cessation offered to patients with current history.  Allergies: Allergies[4]   Medications:  Current Medications[5]  OBJECTIVE: BP 119/77   Pulse 65   Temp 36.8 C (98.2 F)   Resp 20   Wt 82.6 kg (182 lb)   SpO2 97%   BMI 35.54 kg/m    Physical Exam Vitals and nursing note reviewed.  Constitutional:      General: She is not in acute distress.    Appearance: Normal appearance. She  is not ill-appearing or toxic-appearing.  HENT:     Head: Normocephalic and atraumatic.  Eyes:     Pupils: Pupils are equal, round, and reactive to light.  Cardiovascular:     Rate and Rhythm: Normal rate and regular rhythm.     Heart sounds: Normal heart sounds.  Pulmonary:     Effort: Pulmonary effort is normal. No respiratory distress.     Breath sounds: Normal breath  sounds.  Chest:  Breasts:    Right: Normal. No swelling, inverted nipple, mass, nipple discharge or tenderness.     Left: Normal. No swelling, inverted nipple, mass, nipple discharge or tenderness.  Abdominal:     Palpations: Abdomen is soft.  Musculoskeletal:        General: No swelling or tenderness. Normal range of motion.     Cervical back: Normal range of motion and neck supple. No rigidity.  Lymphadenopathy:     Cervical: No cervical adenopathy.     Upper Body:     Right upper body: No supraclavicular or axillary adenopathy.     Left upper body: No supraclavicular or axillary adenopathy.  Skin:    General: Skin is warm.     Capillary Refill: Capillary refill takes less than 2 seconds.  Neurological:     General: No focal deficit present.     Mental Status: She is alert and oriented to person, place, and time.  Psychiatric:        Mood and Affect: Mood normal.    ECOG Performance Status:  2 - Symptomatic, <50% confined to bed Pain Score: 0  Lab Results  Component Value Date   WBC 6.1 09/23/2024   HGB 9.8 (L) 09/23/2024   HCT 31.2 (L) 09/23/2024   PLT 242 09/23/2024   NEUTROABS 3.3 09/23/2024   Lab Results  Component Value Date   NA 137 09/23/2024   K 4.5 09/23/2024   CL 104 09/23/2024   CO2 24.0 09/23/2024   BUN 26 (H) 09/23/2024   CREATININE 1.10 09/23/2024   GLU 91 09/23/2024   CALCIUM 8.4 (L) 09/23/2024   MG 2.0 09/23/2024   PHOS 4.7 (H) 09/23/2024   EGFR 49 (L) 09/23/2024   Lab Results  Component Value Date   PROT 8.1 (H) 09/20/2024   ALBUMIN 3.6 09/20/2024   BILITOT 0.4 09/20/2024   AST 58 (H) 09/20/2024   ALT 63 (H) 09/20/2024   Lab Results  Component Value Date   TSH 2.407 09/20/2024   No results found for: HEPBCAB, HBSAG, HEPBSAB Lab Results  Component Value Date   IRON 75 09/20/2024   TIBC 397 09/20/2024   LABIRON 19 (L) 09/20/2024   FERRITIN 30.6 09/20/2024   No results found for: CBCDIF, VITAMINB12, COPPER, ZINC Lab  Results  Component Value Date/Time   HGB 9.8 (L) 09/23/2024 02:51 AM   HGB 10.2 (L) 09/22/2024 03:23 AM   HGB 10.2 (L) 09/21/2024 03:10 AM   HGB 10.8 (L) 09/20/2024 03:46 PM   HGB 12.7 01/29/2018 02:29 AM   HGB 13.7 01/28/2018 05:20 PM   LABIRON 19 (L) 09/20/2024 05:52 PM    Lab Results  Component Value Date/Time   PLT 242 09/23/2024 02:51 AM   PLT 236 09/22/2024 03:23 AM   PLT 239 09/21/2024 03:10 AM   PLT 247 09/20/2024 03:46 PM   PLT 209 01/29/2018 02:29 AM   PLT 223 01/28/2018 05:20 PM   No results found for: IMMFREELC, IGA, IGM, IGG, KAPFS, LAMFS  Radiographic Studies: ECG 12 Lead Result  Date: 09/29/2024 Ventricular-paced rhythm Confirmed by Zhu, Xiuxian ([id number]) on 09/29/2024 3:48:22 PM  XR Chest Portable Result Date: 09/23/2024 HISTORY:CHF Exam: XR CHEST 1 VIEW. COMPARISON: 09/22/2024 FINDINGS:Pacemaker leads appear to be appropriately position. The cardiomediastinal silhouette is enlarged. The pulmonary vascular is normal in appearance and distribution.   Increased interstitial opacity within the left lung base consistent with atelectasis or scarring. Right lung remains clear. No pleural effusion seen.   No acute abnormality of the visualized bones identified.   Left basilar atelectasis or scarring. Electronically signed by:  Lamar Slight MD  09/23/2024 09:36 AM EDT RP Workstation: [id number]  Echocardiogram W Colorflow Spectral Doppler Result Date: 09/22/2024 Patient Info Name:     Tayte Childers Age:     4 years DOB:     [birth year] Gender:     Female MRN:     [id number] Accession #:     [id number] Account #:     [id number] Ht:     152 cm Wt:     83 kg BSA:     1.92 m2 BP:     117 /     78 mmHg HR:     65 bpm Exam Date:     09/22/2024 11:47 AM Admit Date:     09/20/2024 Exam Type:     ECHOCARDIOGRAM W COLORFLOW SPECTRAL DOPPLER Technical Quality:     Fair Staff Sonographer:     Audie Dawn Referring Physician:     None Per Patient Pcp Ordering Physician:      Mark Jordan V Study Info Indications      - chf Procedure(s)   Complete two-dimensional, color flow and Doppler transthoracic echocardiogram is performed. Summary   1. The left ventricle is normal in size with mildly increased wall thickness.   2. The left ventricular systolic function is borderline, LVEF is visually estimated at 50-55%.   3. The mitral valve leaflets are mildly thickened with normal leaflet mobility.   4. There is mild to moderate mitral valve regurgitation.   5. There is mild to moderate aortic regurgitation.   6. The left atrium is mildly dilated in size.   7. The right ventricle is normal in size, with normal systolic function.   8. TR maximum velocity: 2.8 m/s  Estimated PASP: 42 mmHg.   9. There is moderate tricuspid regurgitation.   10. The aortic valve is trileaflet with moderately thickened leaflets with normal excursion. Left Ventricle   The left ventricle is normal in size with mildly increased wall thickness. The left ventricular systolic function is borderline, LVEF is visually estimated at 50-55%. Left ventricular diastolic function cannot be accurately assessed. Right Ventricle   The right ventricle is normal in size, with normal systolic function. Additional right ventricle findings: there is a device lead present. Left Atrium   The left atrium is mildly dilated in size. Right Atrium   The right atrium is normal in size. There is a device lead present. Aortic Valve   The aortic valve is trileaflet with moderately thickened leaflets with normal excursion. There is mild to moderate aortic regurgitation. There is no evidence of a significant transvalvular gradient. AV Doppler velocity ratio (dimensionless index):  0.66. Mitral Valve   The mitral valve leaflets are mildly thickened with normal leaflet mobility. There is mild to moderate mitral valve regurgitation. Tricuspid Valve   The tricuspid valve leaflets are normal, with normal leaflet mobility. There is moderate tricuspid  regurgitation. TR maximum velocity: 2.8 m/s  Estimated PASP: 42  mmHg. Pulmonic Valve   The pulmonic valve is poorly visualized, but probably normal. Aorta   The aorta is normal in size in the visualized segments. Inferior Vena Cava   IVC size and inspiratory change suggest normal right atrial pressure. (0-5 mmHg). Pericardium/Pleural   There is no pericardial effusion. Ventricles ---------------------------------------------------------------------- Name                                 Value        Normal ---------------------------------------------------------------------- LV Dimensions 2D/MM ----------------------------------------------------------------------  IVS Diastolic Thickness (2D)                                1.1 cm       0.6-0.9 LVID Diastole (2D)                  4.0 cm       3.8-5.2  LVPW Diastolic Thickness (2D)                                1.1 cm       0.6-0.9 LVID Systole (2D)                   2.7 cm       2.2-3.5 LVOT Diameter                       1.9 cm               LV Mass Index (2D Cubed)           76 g/m2         43-95  Relative Wall Thickness (2D)                                  0.55        <=0.42 LV Function ---------------------------------------------------------------------- LV EF (4C MOD)                        51 %               RV Dimensions 2D/MM ---------------------------------------------------------------------- TAPSE                               1.9 cm         >=1.7 Atria ---------------------------------------------------------------------- Name                                 Value        Normal ---------------------------------------------------------------------- LA Dimensions ---------------------------------------------------------------------- LA Dimension (2D)                   3.5 cm       2.7-3.8 LA Volume Index (4C A-L)        25.14 ml/m2               RA Dimensions ---------------------------------------------------------------------- RA Area (4C)                       18.0 cm2        <=18.0 RA  Area (4C) Index              9.4 cm2/m2               RA ESV Index (4C MOD)             26 ml/m2         15-27 Left Ventricular Outflow Tract ---------------------------------------------------------------------- Name                                 Value        Normal ---------------------------------------------------------------------- LVOT 2D ---------------------------------------------------------------------- LVOT Diameter                       1.9 cm               LVOT Area                          2.8 cm2               LVOT Doppler ---------------------------------------------------------------------- LVOT Peak Velocity                 0.8 m/s Aortic Valve ---------------------------------------------------------------------- Name                                 Value        Normal ---------------------------------------------------------------------- AV Doppler ---------------------------------------------------------------------- AV Peak Velocity                   1.2 m/s               AV Peak Gradient                    5 mmHg               AV Mean Gradient                    3 mmHg               AV VTI                               23 cm               AV Area (Cont Eq Vel)              1.9 cm2               AV Area Index (Cont Eq Vel)     1.0 cm2/m2               AV DI (Vel)                           0.66 Mitral Valve ---------------------------------------------------------------------- Name                                 Value        Normal ---------------------------------------------------------------------- MV Diastolic Function ---------------------------------------------------------------------- MV E Peak Velocity                103 cm/s  MV A Peak Velocity                 56 cm/s               MV E/A                                 1.8               MV Annular TDI ---------------------------------------------------------------------- MV Septal  e' Velocity             7.7 cm/s         >=8.0 MV E/e' (Septal)                      13.3               MV Lateral e' Velocity            8.2 cm/s        >=10.0 MV E/e' (Lateral)                     12.6               MV e' Average                     7.9 cm/s               MV E/e' (Average)                     13.0 Tricuspid Valve ---------------------------------------------------------------------- Name                                 Value        Normal ---------------------------------------------------------------------- TV Regurgitation Doppler ---------------------------------------------------------------------- TR Peak Velocity                   2.8 m/s               Estimated PAP/RSVP ---------------------------------------------------------------------- RA Pressure                        10 mmHg           <=5 RV Systolic Pressure               42 mmHg           <36 Venous ---------------------------------------------------------------------- Name                                 Value        Normal ---------------------------------------------------------------------- IVC/SVC ---------------------------------------------------------------------- IVC Diameter (Insp 2D)              1.8 cm               IVC Diameter (Exp 2D)               2.1 cm         <=2.1  IVC Diameter Percent Change (2D)                                  14 %          >=50  Report Signatures Finalized by Manford Ned  MD on 09/22/2024 03:51 PM  XR Chest Portable Result Date: 09/22/2024 HISTORY:CHF Exam: XR CHEST 1 VIEW. COMPARISON: 09/20/2024 FINDINGS:Pacemaker leads appear to be appropriately position. Cardiac silhouette is upper limits normal for size. Prominence of the central pulmonary vasculature is noted. No infiltrate or consolidation identified. No pleural effusion seen.   No acute abnormality of the visualized bones identified.   Prominence of the central pulmonary vasculature which may be secondary to pulmonary vascular  congestion. Electronically signed by:  Lamar Slight MD  09/22/2024 08:28 AM EDT RP Workstation: [id number]  PVL Venous Duplex Lower Extremity Bilateral Result Date: 09/20/2024 EXAMINATION: US  LOWER EXTREMITY VEINS BILATERAL COMPARISON: None available. INDICATION: 88 year old female with acute pulmonary embolus on CT. Concern for DVT FINDINGS: Duplex Doppler ultrasound evaluation of the bilateral lower extremity deep veins from the groin to the upper calf  is performed using gray scale and color Doppler imaging. Grayscale evaluation demonstrates all the veins are fully compressible. No acute or chronic deep vein thrombus formation is evident. Doppler analysis demonstrates that venous flow is normal, spontaneous, phasic and augmentable.  No evidence of bilateral lower extremity deep vein thrombosis. Electronically signed by:  Toribio Presser MD  09/20/2024 10:18 PM EDT RP Workstation: [id number]  CTA Chest W Contrast Result Date: 09/20/2024  ADDENDUM #1 Addendum: I called this report to Dr. Marry Shaggy at 6:58 PM central time Electronically signed by:  Elsie Gustin MD  09/20/2024 09:46 PM EDT RP Workstation: [id number]  ORIGINAL REPORT EXAMINATION: CT CHEST ANGIOGRAPHY WITH IV CONTRAST CLINICAL INDICATION: Female, 88 years old. Dyspnea on exertion with sat of 87% wide mediastinum on chest x-ray. TECHNIQUE: This examination was performed according to an angiographic protocol with 3D post-processing. This involves 3D reconstructions, MIPs, volume rendered images and/or shaded surface rendering. One or more of the following dose reduction techniques were used: Automated exposure control, adjustment of the mA and/or kV according to patient size, and/or iterative reconstruction. Unless otherwise specified, incidental findings do not require dedicated imaging follow-up. CONTRAST: 60 mL of Isovue 300. COMPARISON: XR Chest 09/20/2024, 3:58 PM. FINDINGS: LOWER NECK: Visualized thyroid  gland and soft tissues  are normal. LUNGS AND AIRWAYS: Airways are clear. No evidence of airspace or interstitial process. No nodules. PLEURA: No pleural effusion. No pneumothorax. Hemidiaphragms are normally positioned. MEDIASTINUM AND LYMPH NODES: No mediastinal mass or fluid collection. Normal size mediastinal, hilar, and axillary lymph nodes. THORACIC AORTA: Normal caliber and configuration. No evidence of intramural hematoma or dissection. PULMONARY ARTERIES: Normal caliber. There is a single embolus identified in the main right lower lobe pulmonary artery, best seen on CT series #5 image 152. Small clot volume. No right heart failure or abnormal RV LV ratio. HEART: Normal heart size. No pericardial effusion. No coronary calcifications. OSSEOUS STRUCTURES AND CHEST WALL: Intact. UPPER ABDOMEN: No acute or significant abnormalities.  1. Study is positive for pulmonary embolus in the right lower lobe main branch vessel. Normal RV LV ratio. No right heart failure signs. 2. Normal aortic arch and descending aorta. 3. Clear lungs. Electronically signed by:  Elsie Gustin MD  09/20/2024 07:53 PM EDT RP Workstation: [id number]  XR Chest Portable Result Date: 09/20/2024 HISTORY:SHORTNESS OF BREATH Exam: XR CHEST 1 VIEW. COMPARISON: 01/28/2018 FINDINGS:Pacemaker leads appear to be appropriately position. The cardiomediastinal silhouette is within normal limits for size .   The pulmonary vascular is normal in appearance and distribution.   No infiltrate or consolidation identified. No pleural effusion seen.   No acute  abnormality of the visualized bones identified.   No acute cardiopulmonary process. Electronically signed by:  Lamar Slight MD  09/20/2024 04:14 PM EDT RP Workstation: [id number]   ASSESSMENT / RECOMMENDATIONS:  No problem-specific Assessment & Plan notes found for this encounter.  Darcey Cardy is a 88 y.o. female with coronary artery disease, hypertension, hyperlipidemia, CHF EF 45 to 50% grade 1 diastolic  dysfunction, sinus node dysfunction status post pacemaker who presents for evaluation of pulmonary embolism.   It is unclear when she developed the PE and whether patient was on xarelto  or eliquis at the time. Due to the gradually worsening symptoms, I suspect her respiratory issues at the time of admission were due to heart failure. We discussed that we will continue with warfarin (INR 2-3). She will require long term/ lifelong anticoagulation due to Afib/ relatively unprovoked nature of PE. She reports being relatively immobile over the last few months due to chronic fatigue, which may have been a contributing factor, however I am concerned that she developed a blood clot while on anticoagulation.   No additional workup required at this time. Monitor closely for bleeding/ bruising. She has not had a mammogram in more than 10 years and I recommend a repeat mammogram to assess for an underlying malignancy. She has no overt signs of bleeding per rectum at this time and will hold off on repeating a colonoscopy unless necessary.   Recommendation - Continue with warfarin (INR 2-3). Follow up closely at Coumadin clinic for warfarin management - Recommend mammogram for cancer screening - RTO 3 months     Issues concerning the diagnosis, treatment and follow up plan were discussed. There were no barriers to understanding. The explanation was well received by the patient who then verbalized understanding.  Patient and their family understood and are in agreement with the current plan of care.  All questions and concerns were addressed. They know to call with any questions or concerns prior to next visit.  Total time spent was 60 minutes with more than 50% of the time spent on face-to-face counseling. The patient was seen independently today.  Portions of this note may be dictated using Dragon natural speaking voice recognition software.  Variances in spelling and vocabulary are possible and unintentional.     Thank you for referring Jamie Morales.  Dirk Lander, MD Hematology and Medical Oncology  [facility]  Future Appointments  Date Time Provider Department Center  10/23/2024  1:30 PM Bobbye Dorethia PARAS, PharmD [facility] [place]  01/17/2025 12:45 PM [facility] CHERIE POWELL EAGLES  INTAKE LAB [facility] [place]  01/17/2025  1:00 PM Charlene Dorothyann Saha, PA [facility] [place]  05/11/2025  9:40 AM Royal, Garnette Barters, MD [facility] [place]        [1] Past Medical History: Diagnosis Date   Essential (primary) hypertension    [SE: Hypertension]   Unspecified atrial fibrillation    (CMS-HCC)    [SE: A-fib (CMS/HCC)]  [2] Past Surgical History: Procedure Laterality Date   BREAST LUMPECTOMY Left 1970   Benign [SE: BREAST LUMPECTOMY]   CARDIAC PACEMAKER PLACEMENT     [SE: CARDIAC PACEMAKER PLACEMENT]   COLONOSCOPY     [SE: COLONOSCOPY]   ESOPHAGOGASTRODUODENOSCOPY  2019   Hiatal hernia found - Dr. Celestia. [SE: ESOPHAGOGASTRODUODENOSCOPY]  [3] Family History Problem Relation Age of Onset   Heart attack Mother        SE: Mother - Heart attack   Heart attack Father  SE: Father - Heart attack   Heart disease Sister   [4] Allergies Allergen Reactions   Codeine Nausea Only and Nausea And Vomiting    Nausea, headache    Alendronate Sodium    Benzonatate    Ezetimibe-Simvastatin Other (See Comments)   Lisinopril Cough  [5]  Current Outpatient Medications:    acetaminophen (TYLENOL) 325 MG tablet, Take 2 tablets (650 mg total) by mouth every four (4) hours as needed., Disp: 60 tablet, Rfl: 0   albuterol HFA 90 mcg/actuation inhaler, Inhale 2 puffs every four (4) hours as needed for wheezing., Disp: 100 g, Rfl: 0   aluminum-magnesium hydroxide-simethicone (MAALOX MAX) 400-400-40 mg/5 mL suspension, Take 30 mL by mouth every six (6) hours as needed., Disp: 355 mL, Rfl: 0   cholecalciferol, vitamin D3 25 mcg, 1,000  units,, 1,000 unit (25 mcg) tablet, Take 1 tablet (25 mcg total) by mouth in the morning., Disp: 30 tablet, Rfl: 0   cyanocobalamin  1000 MCG tablet, Take 1 tablet (1,000 mcg total) by mouth daily., Disp: 30 tablet, Rfl: 2   flecainide (TAMBOCOR) 100 MG tablet, Take 0.5 tablets (50 mg total) by mouth two (2) times a day., Disp: 90 tablet, Rfl: 2   furosemide (LASIX) 20 MG tablet, Take 1 tablet (20 mg total) by mouth two (2) times a day for 13 days., Disp: 26 tablet, Rfl: 0   hydrOXYzine (ATARAX) 50 MG tablet, Take 1 tablet (50 mg total) by mouth every six (6) hours as needed for itching., Disp: 30 tablet, Rfl: 0   levalbuterol (XOPENEX) 0.63 mg/3 mL nebulizer solution, Inhale 3 mL (0.63 mg total) by nebulization every four (4) hours as needed (Shortness of breath)., Disp: 3 mL, Rfl: 12   metoPROLOL succinate (TOPROL-XL) 25 MG 24 hr tablet, Take 1 tablet (25 mg total) by mouth daily., Disp: 90 tablet, Rfl: 3   midodrine (PROAMATINE) 5 MG tablet, Take 1 tablet (5 mg total) by mouth every twelve (12) hours. (Patient not taking: Reported on 10/11/2024), Disp: 60 tablet, Rfl: 1   omeprazole (PRILOSEC) 20 MG capsule, Take 1 capsule (20 mg total) by mouth daily., Disp: , Rfl:    pantoprazole (PROTONIX) 40 MG tablet, Take 1 tablet (40 mg total) by mouth Two (2) times a day (30 minutes before a meal)., Disp: 180 tablet, Rfl: 0   polyethylene glycol (MIRALAX) 17 gram packet, Take 17 g by mouth daily., Disp: 30 packet, Rfl: 2   vitamins A,C,E-zinc-copper (ICAPS AREDS) 4,296 mcg-226 mg-90 mg cap, Take 1 tablet by mouth in the morning., Disp: 30 capsule, Rfl: 0   warfarin (JANTOVEN) 5 MG tablet, Take 1 tablet (5 mg total) by mouth daily with evening meal., Disp: 20 tablet, Rfl: 1
